# Patient Record
Sex: Male | Born: 1960 | Race: Black or African American | Hispanic: No | Marital: Married | Smoking: Never smoker
Health system: Southern US, Community
[De-identification: ages and names within clinical notes are randomized; demographics above are authoritative.]

## PROBLEM LIST (undated history)

## (undated) DIAGNOSIS — K649 Unspecified hemorrhoids: Secondary | ICD-10-CM

## (undated) HISTORY — DX: Unspecified hemorrhoids: K64.9

## (undated) HISTORY — PX: WISDOM TOOTH EXTRACTION: SHX21

---

## 2004-03-01 ENCOUNTER — Ambulatory Visit: Payer: Self-pay

## 2012-07-05 ENCOUNTER — Ambulatory Visit: Payer: Self-pay | Admitting: General Surgery

## 2012-07-26 ENCOUNTER — Encounter: Payer: Self-pay | Admitting: *Deleted

## 2012-08-02 ENCOUNTER — Ambulatory Visit: Payer: Self-pay | Admitting: General Surgery

## 2012-08-30 ENCOUNTER — Ambulatory Visit: Payer: Self-pay | Admitting: General Surgery

## 2012-10-11 ENCOUNTER — Ambulatory Visit: Payer: Self-pay | Admitting: General Surgery

## 2012-11-04 ENCOUNTER — Encounter: Payer: Self-pay | Admitting: *Deleted

## 2013-01-11 ENCOUNTER — Ambulatory Visit (INDEPENDENT_AMBULATORY_CARE_PROVIDER_SITE_OTHER): Payer: 59 | Admitting: General Surgery

## 2013-01-11 ENCOUNTER — Encounter: Payer: Self-pay | Admitting: General Surgery

## 2013-01-11 VITALS — BP 108/70 | HR 68 | Resp 12 | Ht 74.0 in | Wt 213.0 lb

## 2013-01-11 DIAGNOSIS — K625 Hemorrhage of anus and rectum: Secondary | ICD-10-CM | POA: Insufficient documentation

## 2013-01-11 DIAGNOSIS — K649 Unspecified hemorrhoids: Secondary | ICD-10-CM

## 2013-01-11 NOTE — Patient Instructions (Signed)
Patient wishes to check schedule and call the office back once ready to proceed with colonoscopy.

## 2013-01-11 NOTE — Progress Notes (Signed)
Patient ID: Jose Hutchinson, male   DOB: 22-Jan-1960, 52 y.o.   MRN: [id number]  Chief Complaint  Patient presents with  . Other    hemorrhoids    HPI Jose Hutchinson is a 52 y.o. male here today for a evaluation of hemorrhoids. States he has random episodes of rectal bleeding worse with constipation. Bowels move every day and occasionally twice a day. He had reported to his primary care physician in June of this year constipation developing gradually, in spite of reports of the ileum bowel movements. Last previous episode of rectal bleeding was 2011. He has had at least three episodes over the past 6 months. Uses metamucil daily to help.  He does work out at [employer] and does strenuous exercises that may be making it worse. Blood seems to be on toilet tissue and mixed in the stool (rather than coating the stool). Nofamily history of colon cancer that he is aware of, but reports his parents (father recently passed) were not very forthcoming about history.   HPI  Past Medical History  Diagnosis Date  . Hemorrhoid     Past Surgical History  Procedure Laterality Date  . Wisdom tooth extraction      No family history on file.  Social History History  Substance Use Topics  . Smoking status: Never Smoker   . Smokeless tobacco: Never Used  . Alcohol Use: Yes    No Known Allergies  Current Outpatient Prescriptions  Medication Sig Dispense Refill  . Psyllium (METAMUCIL PO) Take by mouth daily.       No current facility-administered medications for this visit.    Review of Systems Review of Systems  Constitutional: Negative.   Respiratory: Negative.   Cardiovascular: Negative.   Gastrointestinal: Positive for abdominal pain, abdominal distention, anal bleeding and rectal pain. Negative for nausea, vomiting, diarrhea, constipation and blood in stool.    Blood pressure 108/70, pulse 68, resp. rate 12, height 6\' 2"  (1.88 m), weight 213 lb (96.616 kg).  Physical Exam Physical Exam   Constitutional: He is oriented to person, place, and time. He appears well-developed and well-nourished.  Cardiovascular: Normal rate and regular rhythm.   Pulmonary/Chest: Effort normal and breath sounds normal.  Abdominal: Soft. Bowel sounds are normal. He exhibits no distension and no mass. There is no tenderness.  Genitourinary: Rectal exam shows internal hemorrhoid. Rectal exam shows no external hemorrhoid, no fissure and anal tone normal. Guaiac negative stool. Prostate is not enlarged and not tender.  Neurological: He is alert and oriented to person, place, and time.  Skin: Skin is warm and dry.    Data Reviewed PCP notes of June 2014.  Anoscopy was completed. Some minimally prominent internal hemorrhoids without evidence of bleeding appreciated. Visualized lower rectal mucosa was normal. Stool is Hemoccult negative.  Assessment    History of recovery did episodes of rectal bleeding with blood mixed in with the stool, modest internal hemorrhoids on anoscopy.     Plan    His bleeding was likely related to the internal hemorrhoids, but the report of blood mixed in with the stool is not typical for this problem. A screening colonoscopy was felt appropriate, and the pros and cons of the procedure reviewed.  The patient had several laboratory studies completed in June a copy of these will be requested.     Patient wishes to check schedule and call the office back once ready to proceed with colonoscopy. Miralax prescription will be sent in once date is arranged.  Earline Mayotte 01/11/2013, 9:33 PM

## 2013-09-14 ENCOUNTER — Telehealth: Payer: Self-pay | Admitting: *Deleted

## 2013-09-14 MED ORDER — POLYETHYLENE GLYCOL 3350 17 GM/SCOOP PO POWD: ORAL | Status: DC

## 2013-09-14 NOTE — Telephone Encounter (Signed)
Patient has been scheduled for a colonoscopy on 10-05-13 at [facility]. Miralax prescription has been sent to patient's pharmacy.   Dr. Bary Castilla doesn't need to see him officially until the morning of colonoscopy unless he has questions. Patient at this time declines MD office visit.   Patient has been asked to stop by to see RN/CMA to confirm no change in health, medication review, weight and BP check. ([place]). This has been arranged for 10-03-13 per patient's request since he already has the day off work.

## 2013-09-29 ENCOUNTER — Other Ambulatory Visit: Payer: Self-pay | Admitting: General Surgery

## 2013-09-29 DIAGNOSIS — K649 Unspecified hemorrhoids: Secondary | ICD-10-CM

## 2013-10-03 ENCOUNTER — Ambulatory Visit (INDEPENDENT_AMBULATORY_CARE_PROVIDER_SITE_OTHER): Payer: Self-pay | Admitting: *Deleted

## 2013-10-03 VITALS — BP 128/72 | HR 78 | Resp 14 | Ht 74.0 in | Wt 211.0 lb

## 2013-10-03 DIAGNOSIS — K625 Hemorrhage of anus and rectum: Secondary | ICD-10-CM

## 2013-10-03 DIAGNOSIS — K649 Unspecified hemorrhoids: Secondary | ICD-10-CM

## 2013-10-03 NOTE — Progress Notes (Signed)
Patient states he is still having rectal bleeding noted in bowl and on paper. Bleeding is the same as last visit. No new changes in history.

## 2013-10-05 ENCOUNTER — Ambulatory Visit: Payer: Self-pay | Admitting: General Surgery

## 2013-10-05 DIAGNOSIS — K625 Hemorrhage of anus and rectum: Secondary | ICD-10-CM

## 2013-10-05 DIAGNOSIS — K649 Unspecified hemorrhoids: Secondary | ICD-10-CM

## 2013-10-05 DIAGNOSIS — Z1211 Encounter for screening for malignant neoplasm of colon: Secondary | ICD-10-CM

## 2013-10-07 ENCOUNTER — Other Ambulatory Visit: Payer: Self-pay | Admitting: *Deleted

## 2013-10-07 ENCOUNTER — Encounter: Payer: Self-pay | Admitting: General Surgery

## 2013-10-07 DIAGNOSIS — K625 Hemorrhage of anus and rectum: Secondary | ICD-10-CM

## 2013-10-07 HISTORY — PX: COLONOSCOPY: SHX174

## 2013-10-07 MED ORDER — HYDROCORTISONE ACETATE 25 MG RE SUPP: 25.0000 mg | Freq: Two times a day (BID) | RECTAL | Status: DC

## 2013-10-17 ENCOUNTER — Ambulatory Visit (INDEPENDENT_AMBULATORY_CARE_PROVIDER_SITE_OTHER): Payer: Self-pay | Admitting: General Surgery

## 2013-10-17 ENCOUNTER — Encounter: Payer: Self-pay | Admitting: General Surgery

## 2013-10-17 ENCOUNTER — Ambulatory Visit: Payer: 59 | Admitting: General Surgery

## 2013-10-17 VITALS — BP 128/78 | HR 76 | Resp 12 | Ht 72.0 in | Wt 208.0 lb

## 2013-10-17 DIAGNOSIS — K625 Hemorrhage of anus and rectum: Secondary | ICD-10-CM

## 2013-10-17 DIAGNOSIS — K648 Other hemorrhoids: Secondary | ICD-10-CM

## 2013-10-17 NOTE — Progress Notes (Signed)
Patient ID: Jose Hutchinson., male   DOB: 1960/03/27, 53 y.o.   MRN: [id number]  Chief Complaint  Patient presents with  . Routine Post Op    colonoscopy    HPI Macari Zalesky. is a 53 y.o. male  Here today for his post op colonoscopy done on 10/07/13. Patient states he is doing well. The study was completed for a history of rectal bleeding. HPI  Past Medical History  Diagnosis Date  . Hemorrhoid     Past Surgical History  Procedure Laterality Date  . Wisdom tooth extraction    . Colonoscopy  10/07/13    No family history on file.  Social History History  Substance Use Topics  . Smoking status: Never Smoker   . Smokeless tobacco: Never Used  . Alcohol Use: Yes    No Known Allergies  Current Outpatient Prescriptions  Medication Sig Dispense Refill  . hydrocortisone (ANUSOL-HC) 25 MG suppository Place 1 suppository (25 mg total) rectally 2 (two) times daily.  12 suppository  0  . polyethylene glycol powder (GLYCOLAX/MIRALAX) powder 255 grams one bottle for colonoscopy prep  255 g  0  . Psyllium (METAMUCIL PO) Take by mouth daily.       No current facility-administered medications for this visit.    Review of Systems Review of Systems  Constitutional: Negative.   Respiratory: Negative.   Cardiovascular: Negative.     Blood pressure 128/78, pulse 76, resp. rate 12, height 6' (1.829 m), weight 208 lb (94.348 kg).  Physical Exam Physical Exam Rectal exam showed normal sphincter tone. Anoscopy showed a nonbleeding mildly inflamed anterior internal hemorrhoid.    Assessment    Intermittent rectal bleeding secondary to internal hemorrhoids.     Plan    The patient reports properly for symptoms with use of medicated suppositories. A prescription for Anusol-HC suppositories, #12 with the inscription one PR b.i.d. As needed with 2 refills was provided. The patient will call she appreciates a change in his infrequent episodes of rectal bleeding     PCP: Frazier Butt 10/18/2013, 9:15 PM

## 2013-10-17 NOTE — Patient Instructions (Signed)
Patient to return in 10 years colonoscopy

## 2013-10-18 DIAGNOSIS — K648 Other hemorrhoids: Secondary | ICD-10-CM | POA: Insufficient documentation

## 2015-06-04 ENCOUNTER — Encounter: Payer: Self-pay | Admitting: General Surgery

## 2015-06-04 NOTE — Progress Notes (Unsigned)
RX request for Anusol HC supp.  Seen 09/2013 w/ rectal bleeding. Negative colonoscopy.  RX sent to [facility].

## 2016-06-19 ENCOUNTER — Other Ambulatory Visit: Payer: Self-pay

## 2016-06-19 DIAGNOSIS — K625 Hemorrhage of anus and rectum: Secondary | ICD-10-CM

## 2016-06-19 NOTE — Telephone Encounter (Signed)
Informed patient his last visit was in 03/2013 unable to fill due to him being a new patient.

## 2016-06-19 NOTE — Telephone Encounter (Signed)
Agreed, unfortunately he is considered a new patient because it has been >3 years since his last visit. If he has any questions, please let me know. Thank you!

## 2017-06-22 ENCOUNTER — Emergency Department
Admission: EM | Admit: 2017-06-22 | Discharge: 2017-06-22 | Disposition: A | Discharge status: Home / Self Care | Payer: BLUE CROSS/BLUE SHIELD | Attending: Emergency Medicine | Admitting: Emergency Medicine

## 2017-06-22 ENCOUNTER — Other Ambulatory Visit: Payer: Self-pay

## 2017-06-22 ENCOUNTER — Emergency Department: Payer: BLUE CROSS/BLUE SHIELD

## 2017-06-22 ENCOUNTER — Encounter: Payer: Self-pay | Admitting: Emergency Medicine

## 2017-06-22 DIAGNOSIS — S0181XA Laceration without foreign body of other part of head, initial encounter: Secondary | ICD-10-CM

## 2017-06-22 DIAGNOSIS — S0219XB Other fracture of base of skull, initial encounter for open fracture: Secondary | ICD-10-CM | POA: Diagnosis not present

## 2017-06-22 DIAGNOSIS — S098XXA Other specified injuries of head, initial encounter: Secondary | ICD-10-CM | POA: Diagnosis present

## 2017-06-22 DIAGNOSIS — S01112A Laceration without foreign body of left eyelid and periocular area, initial encounter: Secondary | ICD-10-CM | POA: Diagnosis not present

## 2017-06-22 DIAGNOSIS — S82031A Displaced transverse fracture of right patella, initial encounter for closed fracture: Secondary | ICD-10-CM | POA: Diagnosis not present

## 2017-06-22 DIAGNOSIS — W11XXXA Fall on and from ladder, initial encounter: Secondary | ICD-10-CM | POA: Diagnosis not present

## 2017-06-22 DIAGNOSIS — Z23 Encounter for immunization: Secondary | ICD-10-CM | POA: Diagnosis not present

## 2017-06-22 DIAGNOSIS — Y999 Unspecified external cause status: Secondary | ICD-10-CM | POA: Insufficient documentation

## 2017-06-22 DIAGNOSIS — Z79899 Other long term (current) drug therapy: Secondary | ICD-10-CM | POA: Insufficient documentation

## 2017-06-22 DIAGNOSIS — S0990XA Unspecified injury of head, initial encounter: Secondary | ICD-10-CM

## 2017-06-22 DIAGNOSIS — Y939 Activity, unspecified: Secondary | ICD-10-CM | POA: Diagnosis not present

## 2017-06-22 DIAGNOSIS — Y929 Unspecified place or not applicable: Secondary | ICD-10-CM | POA: Insufficient documentation

## 2017-06-22 MED ORDER — AMOXICILLIN-POT CLAVULANATE 875-125 MG PO TABS: 1.0000 | ORAL_TABLET | Freq: Two times a day (BID) | ORAL | 0 refills | Status: DC

## 2017-06-22 MED ORDER — TETANUS-DIPHTH-ACELL PERTUSSIS 5-2.5-18.5 LF-MCG/0.5 IM SUSP
0.5000 mL | Freq: Once | INTRAMUSCULAR | Status: AC
  Administered 2017-06-22: 0.5 mL via INTRAMUSCULAR
  Filled 2017-06-22: qty 0.5

## 2017-06-22 MED ORDER — LIDOCAINE-EPINEPHRINE 1 %-1:100000 IJ SOLN
INTRAMUSCULAR | Status: AC
  Administered 2017-06-22: 13:00:00
  Filled 2017-06-22: qty 1

## 2017-06-22 NOTE — ED Notes (Signed)
See triage note  States he was up a ladder and the ladder slipped  He fell  Landed on right side   having pain with swelling to right wrist  Good pulses  Also having some discomfort to right knee no swelling noted to knee  Also hit head  Laceration noted to forehead

## 2017-06-22 NOTE — ED Provider Notes (Signed)
[facility] Emergency Department Provider Note ____________________________________________   First MD Initiated Contact with Patient 06/22/17 1114     (approximate)  I have reviewed the triage vital signs and the nursing notes.   HISTORY  Chief Complaint Laceration and Fall   HPI Jose Hutchinson. is a 57 y.o. male who presents to the emergency department for treatment and evaluation of laceration to the left eyebrow, right knee and left wrist pain. He states that he was starting to go up the ladder when it slipped out from underneath him.  He struck his head on the latter.  He states that he felt dazed but does not believe that he actually lost consciousness.  He states that he grabbed his shirt and wrapped around his head to control the bleeding.  He was then able to get up and walk and to get his phone to call his wife.  He is unsure of his last tetanus booster.   Past Medical History:  Diagnosis Date  . Hemorrhoid     Patient Active Problem List   Diagnosis Date Noted  . Internal bleeding hemorrhoids 10/18/2013  . Rectal bleeding 01/11/2013  . Hemorrhoids 01/11/2013    Past Surgical History:  Procedure Laterality Date  . COLONOSCOPY  10/07/13  . WISDOM TOOTH EXTRACTION      Prior to Admission medications   Medication Sig Start Date End Date Taking? Authorizing Provider  amoxicillin-clavulanate (AUGMENTIN) 875-125 MG tablet Take 1 tablet by mouth 2 (two) times daily. 06/22/17   Tigerlily Christine, Dessa Phi, FNP  hydrocortisone (ANUSOL-HC) 25 MG suppository Place 1 suppository (25 mg total) rectally 2 (two) times daily. 10/07/13   Christene Lye, MD  polyethylene glycol powder (GLYCOLAX/MIRALAX) powder 255 grams one bottle for colonoscopy prep 09/14/13   Robert Bellow, MD  Psyllium (METAMUCIL PO) Take by mouth daily.    [provider]    Allergies Patient has no known allergies.  No family history on file.  Social History Social  History   Tobacco Use  . Smoking status: Never Smoker  . Smokeless tobacco: Never Used  Substance Use Topics  . Alcohol use: Yes  . Drug use: No    Review of Systems  Constitutional: No fever/chills Eyes: No visual changes. No eye pain. ENT: No sore throat. Cardiovascular: Denies chest pain. Respiratory: Denies shortness of breath. Gastrointestinal: No abdominal pain.  No nausea, no vomiting.  No diarrhea.  No constipation. Genitourinary: Negative for dysuria. Musculoskeletal: Negative for back pain. Skin: Negative for rash. Neurological: Negative for headaches, focal weakness or numbness. ____________________________________________   PHYSICAL EXAM:  VITAL SIGNS: ED Triage Vitals  Enc Vitals Group     BP 06/22/17 0950 134/87     Pulse Rate 06/22/17 0950 70     Resp 06/22/17 0950 20     Temp 06/22/17 0950 98.2 F (36.8 C)     Temp Source 06/22/17 0950 Oral     SpO2 06/22/17 0950 100 %     Weight 06/22/17 0951 210 lb (95.3 kg)     Height 06/22/17 0951 6\' 2"  (1.88 m)     Head Circumference --      Peak Flow --      Pain Score 06/22/17 0957 6     Pain Loc --      Pain Edu? --      Excl. in [place]? --     Constitutional: Alert and oriented. Well appearing and in no acute distress. Eyes: Conjunctivae  are normal. PERRL. EOMI. Head: Atraumatic. Nose: No congestion/rhinnorhea. Mouth/Throat: Mucous membranes are moist.  Oropharynx non-erythematous. Neck: No stridor.  Supple. No focal midline tenderness. Cardiovascular: Normal rate, regular rhythm. Grossly normal heart sounds.  Good peripheral circulation. Respiratory: Normal respiratory effort.  No retractions. Lungs CTAB. Gastrointestinal: Soft and nontender. No distention. No abdominal bruits. No CVA tenderness. Musculoskeletal: Expressive muscles of the face are intact.  Right knee tenderness over the patella. No open wound.  Neurologic:  Normal speech and language. No gross focal neurologic deficits are appreciated. No  gait instability. Skin: 4 cm laceration noted through the left eyebrow and extends vertically to the forehead. Psychiatric: Mood and affect are normal. Speech and behavior are normal.  ____________________________________________   LABS (all labs ordered are listed, but only abnormal results are displayed)  Labs Reviewed - No data to display ____________________________________________  EKG  Patient refused. ____________________________________________  RADIOLOGY  Official radiology report(s): Dg Wrist Complete Right  Result Date: 06/22/2017 CLINICAL DATA:  Right wrist pain after fall. EXAM: RIGHT WRIST - COMPLETE 3+ VIEW COMPARISON:  None. FINDINGS: There is no evidence of fracture or dislocation. There is no evidence of arthropathy or other focal bone abnormality. Soft tissues are unremarkable. IMPRESSION: No definite abnormality seen in the right wrist. Electronically Signed   By: Marijo Conception, M.D.   On: 06/22/2017 10:21   Ct Head Wo Contrast  Result Date: 06/22/2017 CLINICAL DATA:  Fall off a ladder with left frontal injury. Initial encounter. EXAM: CT HEAD WITHOUT CONTRAST TECHNIQUE: Contiguous axial images were obtained from the base of the skull through the vertex without intravenous contrast. COMPARISON:  None. FINDINGS: Brain: No evidence of acute infarction, hemorrhage, hydrocephalus, extra-axial collection or mass lesion/mass effect. Vascular: Negative Skull: Fracture through the anterior wall of the left frontal sinus with depression by up to 2 mm. The fracture continues to the left superior orbital rim. No posterior frontal sinus fracture is seen. Sinuses/Orbits: See above concerning fractures. No postseptal stranding. IMPRESSION: 1. No evidence of intracranial injury. 2. Left frontal sinus anterior wall fracture with depression by 2 mm. Fracture extends to the superior orbital rim. Electronically Signed   By: Monte Fantasia M.D.   On: 06/22/2017 12:08   Dg Knee Complete 4  Views Right  Result Date: 06/22/2017 CLINICAL DATA:  Anterior knee pain after falling from a ladder today. Initial encounter. EXAM: RIGHT KNEE - COMPLETE 4+ VIEW COMPARISON:  None. FINDINGS: There is a minimally displaced fracture involving the lower pole of the patella, best seen on the lateral view. The patella is located. The distal femur, proximal tibia and proximal fibula appear intact. There is a small knee joint effusion. IMPRESSION: Minimally displaced fracture of the lower pole of the patella. This has the propensity to become more displaced with continued weight-bearing. Electronically Signed   By: Richardean Sale M.D.   On: 06/22/2017 12:04    ____________________________________________   PROCEDURES  Procedure(s) performed:   Marland KitchenMarland KitchenLaceration Repair Date/Time: 06/22/2017 4:15 PM Performed by: Victorino Dike, FNP Authorized by: Victorino Dike, FNP   Consent:    Consent obtained:  Verbal   Consent given by:  Patient   Risks discussed:  Pain, poor cosmetic result, need for additional repair and poor wound healing Anesthesia (see MAR for exact dosages):    Anesthesia method:  Local infiltration   Local anesthetic:  Lidocaine 1% WITH epi Laceration details:    Location:  Face   Face location:  L eyebrow   Length (  cm):  4.5 Repair type:    Repair type:  Intermediate Pre-procedure details:    Preparation:  Patient was prepped and draped in usual sterile fashion and imaging obtained to evaluate for foreign bodies Exploration:    Hemostasis achieved with:  Epinephrine   Contaminated: no   Treatment:    Area cleansed with:  Betadine   Amount of cleaning:  Standard   Irrigation solution:  Sterile saline   Irrigation method:  Syringe Skin repair:    Repair method:  Sutures   Suture size:  6-0   Suture material:  Prolene   Suture technique:  Simple interrupted   Number of sutures:  7 Approximation:    Approximation:  Close Post-procedure details:    Dressing:  Open (no  dressing)   Patient tolerance of procedure:  Tolerated well, no immediate complications    Critical Care performed: No  ____________________________________________   INITIAL IMPRESSION / ASSESSMENT AND PLAN / ED COURSE  As part of my medical decision making, I reviewed the following data within the electronic MEDICAL RECORD NUMBER    57 year old male presenting to the emergency department for treatment and evaluation after a fall when a ladder slipped and he and the latter both went down.  CT of the head was obtained which shows a left anterior wall frontal sinus fracture with a 2 mm depression.  The fracture extends to the superior orbital rim.  The patient had no pain.  Extraocular eye movements were demonstrated.  He had no complaint of blurred vision or change in vision.  Otherwise, the CT of the head showed no evidence of intracranial injury.  The x-ray of his right knee does show a minimally displaced fracture of the lower pole of the patella.  The patient was placed in a knee immobilizer and given strict instructions to avoid full weightbearing until he is followed by the orthopedist.  The patient was very reluctant to wear the knee immobilizer and was again advised that he may have long-term complications if he does not comply with recommendations.  Patient will be discharged home.  Due to the laceration in the area of fracture he will be treated for an open sinus fracture with Augmentin.  Patient refused any type of pain medication and declined any pain medication while here.  Tetanus vaccination was updated today.  Patient was given a work note for the next several days, but was advised that he will need to see the primary care provider before returning to work if he is unable to see the orthopedist first.  He was also encouraged to schedule an appointment with the ear, nose, and throat specialist.  Strict ER return precautions were discussed with both him and his wife.  They verbalized  understanding.      ____________________________________________   FINAL CLINICAL IMPRESSION(S) / ED DIAGNOSES  Final diagnoses:  Injury of head, initial encounter  Facial laceration, initial encounter  Open fracture of frontal sinus, initial encounter ([place])  Closed displaced transverse fracture of right patella, initial encounter     ED Discharge Orders        Ordered    amoxicillin-clavulanate (AUGMENTIN) 875-125 MG tablet  2 times daily     06/22/17 1256       Note:  This document was prepared using Dragon voice recognition software and may include unintentional dictation errors.    Victorino Dike, FNP 06/22/17 1621    Nena Polio, MD 06/22/17 [phone]

## 2017-06-22 NOTE — ED Notes (Signed)
First Nurse Note:  Patient to ED from POV via [place], states he fell from an extension ladder this AM.  States he felt dizzy after falling.  Large laceration over left eye, not actively bleeding. Sterile dressing applied.  Patient also complaining of left wrist pain.  Skin warm and dry. Alert and oriented.

## 2017-06-22 NOTE — ED Notes (Signed)
Pt stated he did pass out once he fell from ladder and is refusing an EKG. Pt stated "There is nothing wrong with my heart. I just need some stitches." Kim,RN aware.

## 2017-06-22 NOTE — ED Triage Notes (Signed)
Fell off ladder , was starting up ladder and ladder slipped out from under him. Hit head. Dressing around head with not bleed through noted. Can see laceration L brow, patient says extends up forehead. Denies LOC. States was dazed briefly.

## 2017-06-22 NOTE — Discharge Instructions (Signed)
Please call and schedule appointments with both orthopedics and ENT specialist. It is important that you take the antibiotics as prescribed.  Return to the ER for symptoms that change, worsen, or for new concerns.

## 2018-02-04 ENCOUNTER — Other Ambulatory Visit: Payer: Self-pay | Admitting: Internal Medicine

## 2018-02-04 DIAGNOSIS — R3121 Asymptomatic microscopic hematuria: Secondary | ICD-10-CM

## 2018-02-15 ENCOUNTER — Ambulatory Visit
Admission: RE | Admit: 2018-02-15 | Discharge: 2018-02-15 | Disposition: A | Discharge status: Home / Self Care | Payer: BLUE CROSS/BLUE SHIELD | Source: Ambulatory Visit | Attending: Internal Medicine | Admitting: Internal Medicine

## 2018-02-15 DIAGNOSIS — R3121 Asymptomatic microscopic hematuria: Secondary | ICD-10-CM

## 2018-02-15 MED ORDER — IOPAMIDOL (ISOVUE-300) INJECTION 61%
125.0000 mL | Freq: Once | INTRAVENOUS | Status: AC | PRN
  Administered 2018-02-15: 125 mL via INTRAVENOUS

## 2018-03-03 ENCOUNTER — Telehealth: Payer: Self-pay | Admitting: Radiology

## 2018-03-03 ENCOUNTER — Other Ambulatory Visit: Payer: Self-pay | Admitting: Radiology

## 2018-03-03 ENCOUNTER — Encounter: Payer: Self-pay | Admitting: Urology

## 2018-03-03 ENCOUNTER — Ambulatory Visit (INDEPENDENT_AMBULATORY_CARE_PROVIDER_SITE_OTHER): Payer: BLUE CROSS/BLUE SHIELD | Admitting: Urology

## 2018-03-03 VITALS — BP 144/90 | HR 62 | Ht 74.0 in | Wt 213.2 lb

## 2018-03-03 DIAGNOSIS — D4111 Neoplasm of uncertain behavior of right renal pelvis: Secondary | ICD-10-CM

## 2018-03-03 DIAGNOSIS — R3121 Asymptomatic microscopic hematuria: Secondary | ICD-10-CM | POA: Diagnosis not present

## 2018-03-03 LAB — URINALYSIS, COMPLETE
Bilirubin, UA: NEGATIVE
Glucose, UA: NEGATIVE
Ketones, UA: NEGATIVE
Leukocytes, UA: NEGATIVE
NITRITE UA: NEGATIVE
PH UA: 6 (ref 5.0–7.5)
Specific Gravity, UA: 1.025 (ref 1.005–1.030)
UUROB: 0.2 mg/dL (ref 0.2–1.0)

## 2018-03-03 LAB — MICROSCOPIC EXAMINATION

## 2018-03-03 NOTE — Patient Instructions (Signed)
Ureteroscopy This sheet gives you information about how to care for yourself after your procedure. Your health care provider may also give you more specific instructions. If you have problems or questions, contact your health care provider. What can I expect after the procedure? After the procedure, it is common to have:  A burning sensation when you urinate.  Blood in your urine.  Mild discomfort in the bladder area or kidney area when urinating.  Needing to urinate more often or urgently. Follow these instructions at home:  Medicines  Take over-the-counter and prescription medicines only as told by your health care provider.  If you were prescribed an antibiotic medicine, take it as told by your health care provider. Do not stop taking the antibiotic even if you start to feel better. General instructions  Donot drive for 24 hours if you were given a medicine to help you relax (sedative) during your procedure.  To relieve burning, try taking a warm bath or holding a warm washcloth over your groin.  Drink enough fluid to keep your urine clear or pale yellow. ? Drink two 8-ounce glasses of water every hour for the first 2 hours after you get home. ? Continue to drink water often at home.  You can eat what you usually do.  Keep all follow-up visits as told by your health care provider. This is important. ? If you had a tube placed to keep urine flowing (ureteral stent), ask your health care provider when you need to return to have it removed. Contact a health care provider if:  You have chills or a fever.  You have burning pain for longer than 24 hours after the procedure.  You have blood in your urine for longer than 24 hours after the procedure. Get help right away if:  You have large amounts of blood in your urine.  You have blood clots in your urine.  You have very bad pain.  You have chest pain or trouble breathing.  You are unable to urinate and you have the  feeling of a full bladder. This information is not intended to replace advice given to you by your health care provider. Make sure you discuss any questions you have with your health care provider. Document Released: 01/11/2013 Document Revised: 10/23/2015 Document Reviewed: 10/19/2015 Elsevier Interactive Patient Education  2019 [employer].

## 2018-03-03 NOTE — Telephone Encounter (Signed)
Patient was given the [facility] Information form below as well as the Instructions for Pre-Admission Testing form & a map of [facility].   [address], [place], [place] [address] Telephone: [phone] Fax: [phone]   Thank you for choosing [facility] for your upcoming surgery!  We are always here to assist in your urological needs.  Please read the following information with specific details for your upcoming appointments related to your surgery. Please contact Brayen Bunn at [phone] Option 3 with any questions.  The Name of Your Surgery: cystoscopy, right ureteroscopy, biopsy, stent placement Your Surgery Date: 03/08/2018 Your Surgeon: Hollice Espy  Please call Same Day Surgery at [phone] between the hours of 1pm-3pm one day prior to your surgery. They will inform you of the time to arrive at Same Day Surgery which is located on the second floor of the [facility].   Please refer to the attached letter regarding instructions for Pre-Admission Testing. You will receive a call from the [place] office regarding your appointment with them.  The Pre-Admission Testing office is located at [place], on the first floor of the [facility] in [place] (office is to the right as you enter through the [employer] of the [facility]). Please have all medications you are currently taking and your insurance card available.   Patient was advised to have nothing to eat or drink after midnight the night prior to surgery except that he may have only water until 2 hours before surgery with nothing to drink within 2 hours of surgery.  The patient states he currently takes no blood thinners.  Patient's questions were answered and he expressed understanding of these instructions.

## 2018-03-03 NOTE — H&P (View-Only) (Signed)
03/03/2018 9:11 AM   Jose Hutchinson. [birth year] [id number]  Referring provider: Steele Sizer, MD [address] [place], [place] [address]  Chief Complaint  Patient presents with  . Hematuria    HPI:  58 yo male referred for right renal pelvis mass on CT February 15, 2018.  He underwent urinalysis January 25, 2018 which revealed 4-10 red blood cells per high-powered field, 4-10 wbc's and no bacteria. He appropriately underwent CT scan of the abdomen and pelvis February 15, 2018.  This revealed a large filling defect in the right upper and midpole calyces and renal pelvis that was 46 Hounsfield units on the noncontrast phase, 73 Hounsfield units on the contrasted phase and a large filling defect on the delayed imaging. There was no metastaic disease. The patient did fall from a ladder but that was April or May of 2019 and broke bones on his right side. He did not see blood at the time but thought he saw red urine a few months ago at the gym - only one time and no clots. No flank pain. He is active and does frequent medicine ball workouts. Weight is stable.   He smoked for 10 years and stopped in his 45's. He has no h/o dye, chemical or radiation/chemo exposure. He voids adequately and has no bothersome LUTS.   A PSA was 0.83 January 25, 2018.   PMH: Past Medical History:  Diagnosis Date  . Hemorrhoid     Surgical History: Past Surgical History:  Procedure Laterality Date  . COLONOSCOPY  10/07/13  . WISDOM TOOTH EXTRACTION      Home Medications:  Allergies as of 03/03/2018   No Known Allergies     Medication List       Accurate as of March 03, 2018  9:11 AM. Always use your most recent med list.        hydrocortisone 25 MG suppository Commonly known as:  ANUSOL-HC Place 1 suppository (25 mg total) rectally 2 (two) times daily.   METAMUCIL PO Take by mouth daily.   polyethylene glycol powder powder Commonly known as:  GLYCOLAX/MIRALAX 255 grams one  bottle for colonoscopy prep   sildenafil 20 MG tablet Commonly known as:  REVATIO Take by mouth.       Allergies: No Known Allergies  Family History: History reviewed. No pertinent family history.  Social History:  reports that he has never smoked. He has never used smokeless tobacco. He reports current alcohol use. He reports that he does not use drugs.  ROS:                                        Physical Exam: There were no vitals taken for this visit.  Constitutional:  Alert and oriented, No acute distress. HEENT: [place] AT, moist mucus membranes.  Trachea midline, no masses. Cardiovascular: No clubbing, cyanosis, or edema. Respiratory: Normal respiratory effort, no increased work of breathing. GI: Abdomen is soft, nontender, nondistended, no abdominal masses GU: No CVA tenderness Lymph: No cervical or inguinal lymphadenopathy. Skin: No rashes, bruises or suspicious lesions. Neurologic: Grossly intact, no focal deficits, moving all 4 extremities. Psychiatric: Normal mood and affect. GU:   Laboratory Data: No results found for: WBC, HGB, HCT, MCV, PLT  No results found for: CREATININE  No results found for: PSA  No results found for: TESTOSTERONE  No results found for: HGBA1C  Urinalysis No results found for: COLORURINE, APPEARANCEUR, LABSPEC, PHURINE, GLUCOSEU, HGBUR, BILIRUBINUR, KETONESUR, PROTEINUR, UROBILINOGEN, NITRITE, LEUKOCYTESUR  No results found for: LABMICR, [place], RBCUA, LABEPIT, MUCUS, BACTERIA  Pertinent Imaging: CT a/p  No results found for this or any previous visit. No results found for this or any previous visit. No results found for this or any previous visit. No results found for this or any previous visit. No results found for this or any previous visit. No results found for this or any previous visit. No results found for this or any previous visit. No results found for this or any previous visit.  Assessment &  Plan:    1. Hematuria, microscopic I discussed with the patient the nature, potential benefits, risks and alternatives to cystoscopy, right ureteroscopy, biopsy and stent placment, including side effects of the proposed treatment, the likelihood of the patient achieving the goals of the procedure, and any potential problems that might occur during the procedure or recuperation. We discussed he may need a staged procedure with pre-stent. All questions answered. Patient elects to proceed.   - Urinalysis, Complete  2. Right renal pelvis mass - as above -procedures.  He had a lot of questions we talked about the risk of bleeding and infection, injury to the urethra, bladder or ureter and kidney among others.  We discussed the need sometimes for prolonged extent of her Foley catheter and rare ureteral injury and exploratory laparotomy.  He asked that the long-term prognosis and we discussed we do not know exactly if this mass could be benign or malignant but it is of a size where he may need the right kidney removed in the future but again we do not know right now.  And ask about alternatives and we discussed continued surveillance with repeat imaging but I strongly discouraged that I recommended ureteroscopy.  He had bacteria in the urine so I sent her for culture and will send it for cytology if we have enough urine.  I also discussed with him one of my colleagues would likely be doing the procedure.   No follow-ups on file.  Festus Aloe, MD  [facility] [address] [place], [place] [address] [phone]

## 2018-03-03 NOTE — Progress Notes (Signed)
03/03/2018 9:11 AM   Jose Hutchinson. [birth year] [id number]  Referring provider: Steele Sizer, MD [address] [place], [place] [address]  Chief Complaint  Patient presents with  . Hematuria    HPI:  58 yo male referred for right renal pelvis mass on CT February 15, 2018.  He underwent urinalysis January 25, 2018 which revealed 4-10 red blood cells per high-powered field, 4-10 wbc's and no bacteria. He appropriately underwent CT scan of the abdomen and pelvis February 15, 2018.  This revealed a large filling defect in the right upper and midpole calyces and renal pelvis that was 46 Hounsfield units on the noncontrast phase, 73 Hounsfield units on the contrasted phase and a large filling defect on the delayed imaging. There was no metastaic disease. The patient did fall from a ladder but that was April or May of 2019 and broke bones on his right side. He did not see blood at the time but thought he saw red urine a few months ago at the gym - only one time and no clots. No flank pain. He is active and does frequent medicine ball workouts. Weight is stable.   He smoked for 10 years and stopped in his 28's. He has no h/o dye, chemical or radiation/chemo exposure. He voids adequately and has no bothersome LUTS.   A PSA was 0.83 January 25, 2018.   PMH: Past Medical History:  Diagnosis Date  . Hemorrhoid     Surgical History: Past Surgical History:  Procedure Laterality Date  . COLONOSCOPY  10/07/13  . WISDOM TOOTH EXTRACTION      Home Medications:  Allergies as of 03/03/2018   No Known Allergies     Medication List       Accurate as of March 03, 2018  9:11 AM. Always use your most recent med list.        hydrocortisone 25 MG suppository Commonly known as:  ANUSOL-HC Place 1 suppository (25 mg total) rectally 2 (two) times daily.   METAMUCIL PO Take by mouth daily.   polyethylene glycol powder powder Commonly known as:  GLYCOLAX/MIRALAX 255 grams one  bottle for colonoscopy prep   sildenafil 20 MG tablet Commonly known as:  REVATIO Take by mouth.       Allergies: No Known Allergies  Family History: History reviewed. No pertinent family history.  Social History:  reports that he has never smoked. He has never used smokeless tobacco. He reports current alcohol use. He reports that he does not use drugs.  ROS:                                        Physical Exam: There were no vitals taken for this visit.  Constitutional:  Alert and oriented, No acute distress. HEENT: [name] AT, moist mucus membranes.  Trachea midline, no masses. Cardiovascular: No clubbing, cyanosis, or edema. Respiratory: Normal respiratory effort, no increased work of breathing. GI: Abdomen is soft, nontender, nondistended, no abdominal masses GU: No CVA tenderness Lymph: No cervical or inguinal lymphadenopathy. Skin: No rashes, bruises or suspicious lesions. Neurologic: Grossly intact, no focal deficits, moving all 4 extremities. Psychiatric: Normal mood and affect. GU:   Laboratory Data: No results found for: WBC, HGB, HCT, MCV, PLT  No results found for: CREATININE  No results found for: PSA  No results found for: TESTOSTERONE  No results found for: HGBA1C  Urinalysis No results found for: COLORURINE, APPEARANCEUR, LABSPEC, PHURINE, GLUCOSEU, HGBUR, BILIRUBINUR, KETONESUR, PROTEINUR, UROBILINOGEN, NITRITE, LEUKOCYTESUR  No results found for: LABMICR, [place], RBCUA, LABEPIT, MUCUS, BACTERIA  Pertinent Imaging: CT a/p  No results found for this or any previous visit. No results found for this or any previous visit. No results found for this or any previous visit. No results found for this or any previous visit. No results found for this or any previous visit. No results found for this or any previous visit. No results found for this or any previous visit. No results found for this or any previous visit.  Assessment &  Plan:    1. Hematuria, microscopic I discussed with the patient the nature, potential benefits, risks and alternatives to cystoscopy, right ureteroscopy, biopsy and stent placment, including side effects of the proposed treatment, the likelihood of the patient achieving the goals of the procedure, and any potential problems that might occur during the procedure or recuperation. We discussed he may need a staged procedure with pre-stent. All questions answered. Patient elects to proceed.   - Urinalysis, Complete  2. Right renal pelvis mass - as above -procedures.  He had a lot of questions we talked about the risk of bleeding and infection, injury to the urethra, bladder or ureter and kidney among others.  We discussed the need sometimes for prolonged extent of her Foley catheter and rare ureteral injury and exploratory laparotomy.  He asked that the long-term prognosis and we discussed we do not know exactly if this mass could be benign or malignant but it is of a size where he may need the right kidney removed in the future but again we do not know right now.  And ask about alternatives and we discussed continued surveillance with repeat imaging but I strongly discouraged that I recommended ureteroscopy.  He had bacteria in the urine so I sent her for culture and will send it for cytology if we have enough urine.  I also discussed with him one of my colleagues would likely be doing the procedure.   No follow-ups on file.  Festus Aloe, MD  [facility] [address] [place], [place] [address] [phone]

## 2018-03-04 ENCOUNTER — Encounter
Admission: RE | Admit: 2018-03-04 | Discharge: 2018-03-04 | Disposition: A | Discharge status: Home / Self Care | Payer: BLUE CROSS/BLUE SHIELD | Source: Ambulatory Visit | Attending: Urology | Admitting: Urology

## 2018-03-04 ENCOUNTER — Other Ambulatory Visit: Payer: Self-pay

## 2018-03-04 NOTE — Patient Instructions (Signed)
Your procedure is scheduled on: 03/08/18 Report to Day Surgery.[facility] SECOND FLOOR To find out your arrival time please call [phone] between 1PM - 3PM on 2/14/290.  Remember: Instructions that are not followed completely may result in serious medical risk,  up to and including death, or upon the discretion of your surgeon and anesthesiologist your  surgery may need to be rescheduled.     _X__ 1. Do not eat food after midnight the night before your procedure.                 No gum chewing or hard candies. You may drink clear liquids up to 2 hours                 before you are scheduled to arrive for your surgery- DO not drink clear                 liquids within 2 hours of the start of your surgery.                 Clear Liquids include:  water, apple juice without pulp, clear carbohydrate                 drink such as Clearfast of Gatorade, Black Coffee or Tea (Do not add                 anything to coffee or tea).  __X__2.  On the morning of surgery brush your teeth with toothpaste and water, you                may rinse your mouth with mouthwash if you wish.  Do not swallow any toothpaste of mouthwash.     _X__ 3.  No Alcohol for 24 hours before or after surgery.   _X__ 4.  Do Not Smoke or use e-cigarettes For 24 Hours Prior to Your Surgery.                 Do not use any chewable tobacco products for at least 6 hours prior to                 surgery.  ____  5.  Bring all medications with you on the day of surgery if instructed.   __X__  6.  Notify your doctor if there is any change in your medical condition      (cold, fever, infections).     Do not wear jewelry, make-up, hairpins, clips or nail polish. Do not wear lotions, powders, or perfumes. You may wear deodorant. Do not shave 48 hours prior to surgery. Men may shave face and neck. Do not bring valuables to the hospital.    [facility] is not responsible for any belongings or  valuables.  Contacts, dentures or bridgework may not be worn into surgery. Leave your suitcase in the car. After surgery it may be brought to your room. For patients admitted to the hospital, discharge time is determined by your treatment team.   Patients discharged the day of surgery will not be allowed to drive home.     ____ Take these medicines the morning of surgery with A SIP OF WATER:    1.   2.   3.   4.  5.  6.  ____ Fleet Enema (as directed)   ____ Use CHG Soap as directed  ____ Use inhalers on the day of surgery  ____ Stop metformin 2 days prior to surgery  ____ Take 1/2 of usual insulin dose the night before surgery. No insulin the morning          of surgery.   ____ Stop Coumadin/Plavix/aspirin on   ____ Stop Anti-inflammatories on    ____ Stop supplements until after surgery.    ____ Bring C-Pap to the hospital.

## 2018-03-06 LAB — CULTURE, URINE COMPREHENSIVE

## 2018-03-07 MED ORDER — CEFAZOLIN SODIUM-DEXTROSE 2-4 GM/100ML-% IV SOLN
2.0000 g | INTRAVENOUS | Status: AC
  Administered 2018-03-08: 2 g via INTRAVENOUS

## 2018-03-08 ENCOUNTER — Ambulatory Visit
Admission: RE | Admit: 2018-03-08 | Discharge: 2018-03-08 | Disposition: A | Discharge status: Home / Self Care | Payer: BLUE CROSS/BLUE SHIELD | Attending: Urology | Admitting: Urology

## 2018-03-08 ENCOUNTER — Encounter: Payer: Self-pay | Admitting: *Deleted

## 2018-03-08 ENCOUNTER — Ambulatory Visit: Payer: BLUE CROSS/BLUE SHIELD | Admitting: Anesthesiology

## 2018-03-08 ENCOUNTER — Other Ambulatory Visit: Payer: Self-pay

## 2018-03-08 ENCOUNTER — Encounter
Admission: RE | Disposition: A | Discharge status: Home / Self Care | Payer: Self-pay | Source: Home / Self Care | Attending: Urology

## 2018-03-08 DIAGNOSIS — D4111 Neoplasm of uncertain behavior of right renal pelvis: Secondary | ICD-10-CM

## 2018-03-08 DIAGNOSIS — C651 Malignant neoplasm of right renal pelvis: Secondary | ICD-10-CM | POA: Diagnosis not present

## 2018-03-08 DIAGNOSIS — R3129 Other microscopic hematuria: Secondary | ICD-10-CM | POA: Insufficient documentation

## 2018-03-08 DIAGNOSIS — Z79899 Other long term (current) drug therapy: Secondary | ICD-10-CM | POA: Insufficient documentation

## 2018-03-08 DIAGNOSIS — Z87891 Personal history of nicotine dependence: Secondary | ICD-10-CM | POA: Insufficient documentation

## 2018-03-08 DIAGNOSIS — N2889 Other specified disorders of kidney and ureter: Secondary | ICD-10-CM | POA: Diagnosis present

## 2018-03-08 DIAGNOSIS — R311 Benign essential microscopic hematuria: Secondary | ICD-10-CM | POA: Diagnosis not present

## 2018-03-08 HISTORY — PX: CYSTOSCOPY WITH URETEROSCOPY: SHX5123

## 2018-03-08 HISTORY — PX: CYSTOSCOPY WITH BIOPSY: SHX5122

## 2018-03-08 HISTORY — PX: CYSTOSCOPY WITH STENT PLACEMENT: SHX5790

## 2018-03-08 HISTORY — PX: CYSTOSCOPY W/ RETROGRADES: SHX1426

## 2018-03-08 SURGERY — CYSTOSCOPY WITH URETEROSCOPY
Anesthesia: General | Site: Ureter | Laterality: Right

## 2018-03-08 MED ORDER — IOTHALAMATE MEGLUMINE 43 % IV SOLN
INTRAVENOUS | Status: DC | PRN
  Administered 2018-03-08: 20 mL via URETHRAL

## 2018-03-08 MED ORDER — FAMOTIDINE 20 MG PO TABS
ORAL_TABLET | ORAL | Status: AC
  Administered 2018-03-08: 20 mg
  Filled 2018-03-08: qty 1

## 2018-03-08 MED ORDER — DEXAMETHASONE SODIUM PHOSPHATE 10 MG/ML IJ SOLN
INTRAMUSCULAR | Status: AC
  Filled 2018-03-08: qty 1

## 2018-03-08 MED ORDER — EPHEDRINE SULFATE 50 MG/ML IJ SOLN
INTRAMUSCULAR | Status: AC
  Filled 2018-03-08: qty 1

## 2018-03-08 MED ORDER — ONDANSETRON HCL 4 MG/2ML IJ SOLN
INTRAMUSCULAR | Status: AC
  Filled 2018-03-08: qty 2

## 2018-03-08 MED ORDER — MIDAZOLAM HCL 2 MG/2ML IJ SOLN
INTRAMUSCULAR | Status: DC | PRN
  Administered 2018-03-08: 2 mg via INTRAVENOUS

## 2018-03-08 MED ORDER — PROPOFOL 10 MG/ML IV BOLUS
INTRAVENOUS | Status: DC | PRN
  Administered 2018-03-08: 180 mg via INTRAVENOUS

## 2018-03-08 MED ORDER — PROPOFOL 10 MG/ML IV BOLUS
INTRAVENOUS | Status: AC
  Filled 2018-03-08: qty 20

## 2018-03-08 MED ORDER — EPHEDRINE SULFATE 50 MG/ML IJ SOLN
INTRAMUSCULAR | Status: DC | PRN
  Administered 2018-03-08: 15 mg via INTRAVENOUS

## 2018-03-08 MED ORDER — OXYBUTYNIN CHLORIDE 5 MG PO TABS: 5.0000 mg | ORAL_TABLET | Freq: Three times a day (TID) | ORAL | 0 refills | Status: DC | PRN

## 2018-03-08 MED ORDER — OXYCODONE HCL 5 MG/5ML PO SOLN: 5.0000 mg | Freq: Once | ORAL | Status: DC | PRN

## 2018-03-08 MED ORDER — LACTATED RINGERS IV SOLN
INTRAVENOUS | Status: DC
  Administered 2018-03-08: 08:00:00 via INTRAVENOUS

## 2018-03-08 MED ORDER — ROCURONIUM BROMIDE 100 MG/10ML IV SOLN
INTRAVENOUS | Status: DC | PRN
  Administered 2018-03-08: 20 mg via INTRAVENOUS
  Administered 2018-03-08: 50 mg via INTRAVENOUS

## 2018-03-08 MED ORDER — PHENYLEPHRINE HCL 10 MG/ML IJ SOLN
INTRAMUSCULAR | Status: DC | PRN
  Administered 2018-03-08 (×4): 50 ug via INTRAVENOUS

## 2018-03-08 MED ORDER — FENTANYL CITRATE (PF) 100 MCG/2ML IJ SOLN: 25.0000 ug | INTRAMUSCULAR | Status: DC | PRN

## 2018-03-08 MED ORDER — TAMSULOSIN HCL 0.4 MG PO CAPS: 0.4000 mg | ORAL_CAPSULE | Freq: Every day | ORAL | 0 refills | Status: DC

## 2018-03-08 MED ORDER — KETOROLAC TROMETHAMINE 30 MG/ML IJ SOLN
INTRAMUSCULAR | Status: DC | PRN
  Administered 2018-03-08: 10 mg via INTRAVENOUS

## 2018-03-08 MED ORDER — FENTANYL CITRATE (PF) 100 MCG/2ML IJ SOLN
INTRAMUSCULAR | Status: DC | PRN
  Administered 2018-03-08: 100 ug via INTRAVENOUS

## 2018-03-08 MED ORDER — KETOROLAC TROMETHAMINE 30 MG/ML IJ SOLN
INTRAMUSCULAR | Status: AC
  Filled 2018-03-08: qty 1

## 2018-03-08 MED ORDER — CEFAZOLIN SODIUM-DEXTROSE 2-4 GM/100ML-% IV SOLN
INTRAVENOUS | Status: AC
  Filled 2018-03-08: qty 100

## 2018-03-08 MED ORDER — FAMOTIDINE 20 MG PO TABS: 20.0000 mg | ORAL_TABLET | Freq: Once | ORAL | Status: DC

## 2018-03-08 MED ORDER — HYDROCODONE-ACETAMINOPHEN 5-325 MG PO TABS: 1.0000 | ORAL_TABLET | Freq: Four times a day (QID) | ORAL | 0 refills | Status: DC | PRN

## 2018-03-08 MED ORDER — SUGAMMADEX SODIUM 200 MG/2ML IV SOLN
INTRAVENOUS | Status: AC
  Filled 2018-03-08: qty 2

## 2018-03-08 MED ORDER — MIDAZOLAM HCL 2 MG/2ML IJ SOLN
INTRAMUSCULAR | Status: AC
  Filled 2018-03-08: qty 2

## 2018-03-08 MED ORDER — DEXAMETHASONE SODIUM PHOSPHATE 10 MG/ML IJ SOLN
INTRAMUSCULAR | Status: DC | PRN
  Administered 2018-03-08: 10 mg via INTRAVENOUS

## 2018-03-08 MED ORDER — ROCURONIUM BROMIDE 50 MG/5ML IV SOLN
INTRAVENOUS | Status: AC
  Filled 2018-03-08: qty 1

## 2018-03-08 MED ORDER — SUGAMMADEX SODIUM 200 MG/2ML IV SOLN
INTRAVENOUS | Status: DC | PRN
  Administered 2018-03-08: 200 mg via INTRAVENOUS

## 2018-03-08 MED ORDER — FENTANYL CITRATE (PF) 100 MCG/2ML IJ SOLN
INTRAMUSCULAR | Status: AC
  Filled 2018-03-08: qty 2

## 2018-03-08 MED ORDER — DOCUSATE SODIUM 100 MG PO CAPS: 100.0000 mg | ORAL_CAPSULE | Freq: Two times a day (BID) | ORAL | 0 refills | Status: DC

## 2018-03-08 MED ORDER — ONDANSETRON HCL 4 MG/2ML IJ SOLN
INTRAMUSCULAR | Status: DC | PRN
  Administered 2018-03-08: 4 mg via INTRAVENOUS

## 2018-03-08 MED ORDER — MEPERIDINE HCL 50 MG/ML IJ SOLN: 6.2500 mg | INTRAMUSCULAR | Status: DC | PRN

## 2018-03-08 MED ORDER — PROMETHAZINE HCL 25 MG/ML IJ SOLN: 6.2500 mg | INTRAMUSCULAR | Status: DC | PRN

## 2018-03-08 MED ORDER — OXYCODONE HCL 5 MG PO TABS: 5.0000 mg | ORAL_TABLET | Freq: Once | ORAL | Status: DC | PRN

## 2018-03-08 SURGICAL SUPPLY — 31 items
BAG DRAIN CYSTO-URO LG1000N (MISCELLANEOUS) ×3 IMPLANT
BASKET 3WIRE GEMINI 2.4X120 (MISCELLANEOUS) ×3 IMPLANT
BRUSH SCRUB EZ  4% CHG (MISCELLANEOUS) ×2
BRUSH SCRUB EZ 4% CHG (MISCELLANEOUS) ×1 IMPLANT
CATH URETL 5X70 OPEN END (CATHETERS) ×3 IMPLANT
CONRAY 43 FOR UROLOGY 50M (MISCELLANEOUS) IMPLANT
COVER WAND RF STERILE (DRAPES) IMPLANT
DRSG TELFA 4X3 1S NADH ST (GAUZE/BANDAGES/DRESSINGS) ×3 IMPLANT
ELECT REM PT RETURN 9FT ADLT (ELECTROSURGICAL) ×3
ELECTRODE REM PT RTRN 9FT ADLT (ELECTROSURGICAL) ×1 IMPLANT
GLOVE BIO SURGEON STRL SZ 6.5 (GLOVE) ×2 IMPLANT
GLOVE BIO SURGEONS STRL SZ 6.5 (GLOVE) ×1
GOWN STRL REUS W/ TWL LRG LVL3 (GOWN DISPOSABLE) ×2 IMPLANT
GOWN STRL REUS W/TWL LRG LVL3 (GOWN DISPOSABLE) ×4
GUIDEWIRE GREEN .038 145CM (MISCELLANEOUS) ×3 IMPLANT
GUIDEWIRE STR DUAL SENSOR (WIRE) ×3 IMPLANT
INTRODUCER DILATOR DOUBLE (INTRODUCER) ×3 IMPLANT
KIT TURNOVER CYSTO (KITS) ×3 IMPLANT
NDL SAFETY ECLIPSE 18X1.5 (NEEDLE) ×1 IMPLANT
NEEDLE HYPO 18GX1.5 SHARP (NEEDLE) ×2
PACK CYSTO AR (MISCELLANEOUS) ×3 IMPLANT
SET CYSTO W/LG BORE CLAMP LF (SET/KITS/TRAYS/PACK) ×3 IMPLANT
SHEATH URETERAL 12FR 45CM (SHEATH) ×3 IMPLANT
SOL .9 NS 3000ML IRR  AL (IV SOLUTION) ×2
SOL .9 NS 3000ML IRR UROMATIC (IV SOLUTION) ×1 IMPLANT
STENT URET 6FRX24 CONTOUR (STENTS) IMPLANT
STENT URET 6FRX26 CONTOUR (STENTS) ×3 IMPLANT
SURGILUBE 2OZ TUBE FLIPTOP (MISCELLANEOUS) ×3 IMPLANT
SYRINGE IRR TOOMEY STRL 70CC (SYRINGE) IMPLANT
WATER STERILE IRR 1000ML POUR (IV SOLUTION) ×3 IMPLANT
WATER STERILE IRR 3000ML UROMA (IV SOLUTION) ×3 IMPLANT

## 2018-03-08 NOTE — Anesthesia Postprocedure Evaluation (Signed)
Anesthesia Post Note  Patient: Jose Hutchinson.  Procedure(s) Performed: CYSTOSCOPY WITH URETEROSCOPY (Right Ureter) CYSTOSCOPY WITH RENAL PELVIS MASS BIOPSY (Right Ureter) CYSTOSCOPY WITH STENT PLACEMENT (Right Ureter) CYSTOSCOPY WITH RETROGRADE PYELOGRAM (Right Ureter)  Patient location during evaluation: PACU Anesthesia Type: General Level of consciousness: awake and alert and oriented Pain management: pain level controlled Vital Signs Assessment: post-procedure vital signs reviewed and stable Respiratory status: spontaneous breathing, nonlabored ventilation and respiratory function stable Cardiovascular status: blood pressure returned to baseline and stable Postop Assessment: no signs of nausea or vomiting Anesthetic complications: no     Last Vitals:  Vitals:   03/08/18 1117 03/08/18 1130  BP: 124/83 136/87  Pulse: 72 68  Resp: 19 19  Temp: (!) 36.3 C (!) 36.1 C  SpO2: 98% 100%    Last Pain:  Vitals:   03/08/18 1130  TempSrc: Temporal  PainSc: 0-No pain                 Daquana Paddock

## 2018-03-08 NOTE — Transfer of Care (Signed)
Immediate Anesthesia Transfer of Care Note  Patient: Jose Hutchinson.  Procedure(s) Performed: CYSTOSCOPY WITH URETEROSCOPY (Right Ureter) CYSTOSCOPY WITH RENAL PELVIS MASS BIOPSY (Right Ureter) CYSTOSCOPY WITH STENT PLACEMENT (Right Ureter) CYSTOSCOPY WITH RETROGRADE PYELOGRAM (Right Ureter)  Patient Location: PACU  Anesthesia Type:General  Level of Consciousness: drowsy and patient cooperative  Airway & Oxygen Therapy: Patient Spontanous Breathing and Patient connected to face mask oxygen  Post-op Assessment: Report given to RN and Post -op Vital signs reviewed and stable  Post vital signs: Reviewed and stable  Last Vitals:  Vitals Value Taken Time  BP 122/81 03/08/2018 10:49 AM  Temp 36.3 C 03/08/2018 10:49 AM  Pulse 68 03/08/2018 10:50 AM  Resp 16 03/08/2018 10:50 AM  SpO2 100 % 03/08/2018 10:50 AM  Vitals shown include unvalidated device data.  Last Pain:  Vitals:   03/08/18 0748  TempSrc: Tympanic  PainSc: 0-No pain         Complications: No apparent anesthesia complications

## 2018-03-08 NOTE — Interval H&P Note (Signed)
History and Physical Interval Note:  03/08/2018 9:07 AM  Jose Hutchinson.  has presented today for surgery, with the diagnosis of right renal pelvic mass  The various methods of treatment have been discussed with the patient and family. After consideration of risks, benefits and other options for treatment, the patient has consented to  Procedure(s): CYSTOSCOPY WITH URETEROSCOPY (Right) CYSTOSCOPY WITH RENAL PELVIS MASS BIOPSY (Right) CYSTOSCOPY WITH STENT PLACEMENT (Right) as a surgical intervention .  The patient's history has been reviewed, patient examined, no change in status, stable for surgery.  I have reviewed the patient's chart and labs.  Questions were answered to the patient's satisfaction.    RRR CTAB  Hollice Espy

## 2018-03-08 NOTE — OR Nursing (Signed)
Attestation and day of surgery note in epic by MD.

## 2018-03-08 NOTE — Discharge Instructions (Signed)
You have a ureteral stent in place.  This is a tube that extends from your kidney to your bladder.  This may cause urinary bleeding, burning with urination, and urinary frequency.  Please call our office or present to the ED if you develop fevers >101 or pain which is not able to be controlled with oral pain medications.  You may be given either Flomax and/ or ditropan to help with bladder spasms and stent pain in addition to pain medications.    Your stent is on a string taped to the head of your penis.  I like you to keep this in place until you hear back from me want to have your pathology results.  I will give you further instructions regarding stent removal at that point in time.  [facility] [address], [place], [place] [address] [phone]    Ureteroscopy, Care After This sheet gives you information about how to care for yourself after your procedure. Your health care provider may also give you more specific instructions. If you have problems or questions, contact your health care provider. What can I expect after the procedure? After the procedure, it is common to have:  A burning sensation when you urinate.  Blood in your urine.  Mild discomfort in the bladder area or kidney area when urinating.  Needing to urinate more often or urgently. Follow these instructions at home:  Medicines  Take over-the-counter and prescription medicines only as told by your health care provider.  If you were prescribed an antibiotic medicine, take it as told by your health care provider. Do not stop taking the antibiotic even if you start to feel better. General instructions  Donot drive for 24 hours if you were given a medicine to help you relax (sedative) during your procedure.  To relieve burning, try taking a warm bath or holding a warm washcloth over your groin.  Drink enough fluid to keep your urine clear or pale yellow. ? Drink two 8-ounce  glasses of water every hour for the first 2 hours after you get home. ? Continue to drink water often at home.  You can eat what you usually do.  Keep all follow-up visits as told by your health care provider. This is important. ? If you had a tube placed to keep urine flowing (ureteral stent), ask your health care provider when you need to return to have it removed. Contact a health care provider if:  You have chills or a fever.  You have burning pain for longer than 24 hours after the procedure.  You have blood in your urine for longer than 24 hours after the procedure. Get help right away if:  You have large amounts of blood in your urine.  You have blood clots in your urine.  You have very bad pain.  You have chest pain or trouble breathing.  You are unable to urinate and you have the feeling of a full bladder. This information is not intended to replace advice given to you by your health care provider. Make sure you discuss any questions you have with your health care provider. Document Released: 01/11/2013 Document Revised: 10/23/2015 Document Reviewed: 10/19/2015 Elsevier Interactive Patient Education  2019 [place]   1) The drugs that you were given will stay in your system until tomorrow so for the next 24 hours you should not:  A) Drive an automobile B) Make any legal decisions C) Drink  any alcoholic beverage   2) You may resume regular meals tomorrow.  Today it is better to start with liquids and gradually work up to solid foods.  You may eat anything you prefer, but it is better to start with liquids, then soup and crackers, and gradually work up to solid foods.   3) Please notify your doctor immediately if you have any unusual bleeding, trouble breathing, redness and pain at the surgery site, drainage, fever, or pain not relieved by medication.    4) Additional Instructions:        Please contact  your physician with any problems or Same Day Surgery at [phone], Monday through Friday 6 am to 4 pm, or [place] at [facility] number at [phone].

## 2018-03-08 NOTE — Anesthesia Preprocedure Evaluation (Signed)
Anesthesia Evaluation  Patient identified by MRN, date of birth, ID band Patient awake    Reviewed: Allergy & Precautions, NPO status , Patient's Chart, lab work & pertinent test results  History of Anesthesia Complications Negative for: history of anesthetic complications  Airway Mallampati: II  TM Distance: >3 FB Neck ROM: Full    Dental  (+) Partial Lower   Pulmonary neg pulmonary ROS, neg sleep apnea, neg COPD,    breath sounds clear to auscultation- rhonchi (-) wheezing      Cardiovascular Exercise Tolerance: Good (-) hypertension(-) CAD and (-) Past MI  Rhythm:Regular Rate:Normal - Systolic murmurs and - Diastolic murmurs    Neuro/Psych negative neurological ROS  negative psych ROS   GI/Hepatic negative GI ROS, Neg liver ROS,   Endo/Other  negative endocrine ROSneg diabetes  Renal/GU Renal mass     Musculoskeletal negative musculoskeletal ROS (+)   Abdominal (+) - obese,   Peds  Hematology negative hematology ROS (+)   Anesthesia Other Findings Past Medical History: No date: Hemorrhoid   Reproductive/Obstetrics                             Anesthesia Physical Anesthesia Plan  ASA: II  Anesthesia Plan: General   Post-op Pain Management:    Induction: Intravenous  PONV Risk Score and Plan: 1 and Ondansetron and Midazolam  Airway Management Planned: Oral ETT  Additional Equipment:   Intra-op Plan:   Post-operative Plan: Extubation in OR  Informed Consent: I have reviewed the patients History and Physical, chart, labs and discussed the procedure including the risks, benefits and alternatives for the proposed anesthesia with the patient or authorized representative who has indicated his/her understanding and acceptance.     Dental advisory given  Plan Discussed with: CRNA and Anesthesiologist  Anesthesia Plan Comments:         Anesthesia Quick Evaluation

## 2018-03-08 NOTE — Anesthesia Post-op Follow-up Note (Signed)
Anesthesia QCDR form completed.        

## 2018-03-08 NOTE — Op Note (Signed)
Date of procedure: 03/08/18  Preoperative diagnosis:  1. Right renal pelvic mass 2. Microscopic hematuria  Postoperative diagnosis:  1. Same as above  Procedure: 1. Cystoscopy 2. Right retrograde pyelogram 3. Right diagnostic ureteroscopy 4. Right renal pelvic mass biopsy 5. Right ureteral stent placement  Surgeon: Hollice Espy, MD  Anesthesia: General  Complications: None  Intraoperative findings: Large filling defect ~3.5 cm within the renal pelvis.  Papillary tumor on direct visualization highly concerning for urothelial carcinoma.  Multiple biopsies taken.  Stent placed on tether.  EBL: Minimal  Specimens: Right renal pelvic mass  Drains: 6 x 26 French double-J ureteral stent on right  Indication: Jose Hutchinson. is a 58 y.o. patient with microscopic hematuria after a fall from a ladder found to have fairly large filling defect within the right renal pelvis/hilum.  After reviewing the management options for treatment, he elected to proceed with the above surgical procedure(s). We have discussed the potential benefits and risks of the procedure, side effects of the proposed treatment, the likelihood of the patient achieving the goals of the procedure, and any potential problems that might occur during the procedure or recuperation. Informed consent has been obtained.  Description of procedure:  The patient was taken to the operating room and general anesthesia was induced.  The patient was placed in the dorsal lithotomy position, prepped and draped in the usual sterile fashion, and preoperative antibiotics were administered. A preoperative time-out was performed.   21 [place] scope was advanced per urethra into the bladder.  The bladder was inspected and no obvious tumor or lesions were identified.  Attention was turned to the right ureteral orifice which was cannulated using a sensor wire and a 5 [place] open-ended ureteral catheter.  A gentle retrograde pyelogram was  performed which showed a delicate appearing ureter without filling defects but a large at least 0.5 x 2.5 cm filling defect involving the renal pelvis, circular in appearance highly concerning for malignancy.  A sensor wire was in place up to level of the kidney.  A dual-lumen ureteral access sheath was then used to introduce a second Super Stiff wire.  In order to help facilitate basketing of fragment, I did elect to use a 12/14 [place] Cook ureteral access sheath which was advanced to the proximal ureter without difficulty under fluoroscopic guidance.  The inner cannula and Super Stiff wire were then removed.  A dual-lumen flexible ureteroscope was then able to be advanced to the renal pelvis, notably it was relatively tight in the proximal ureter.  Within the renal pelvis, large papillary mass was identified.  This was most consistent with urothelial carcinoma based on its visual appearance.  A Gemini basket was used to biopsy several large pieces of tumor which were passed off the field as right renal mass.  Given the size of the tumor, transurethral ablation with laser was not possible or appropriate.  Scope was then backed out length of the ureter inspecting along the way.  There are no ureteral tumors or injuries appreciated.  The safety wire was then backloaded over rigid cystoscope.  A 6 x 26 French double-J ureteral stent was then advanced over the wire up to level the kidney.  The wires partially drawn till focal stone within the renal pelvis.  The wires and fully withdrawn a focal stone within the bladder.  The stent string was left affixed to the distal coil of the stent.  The string was attached to the patient's glans using Mastisol and Tegaderm.  Patient was then clean and dry, repositioned the supine position, reversed anesthesia, taken the PACU in stable condition.  Plan: Findings were discussed with the patient's wife.  I did express my concern for urothelial carcinoma.  Soon as I have the  pathology, I will call him with plans for the stent.  If we do not need to go back and get additional tissue samples, will have him self remove the stent, otherwise we will leave it in place if his second procedure is deemed necessary.  He will follow-up in my office in 2 weeks to discuss pathology.  Hollice Espy, M.D.

## 2018-03-08 NOTE — Anesthesia Procedure Notes (Signed)
Procedure Name: Intubation Date/Time: 03/08/2018 9:42 AM Performed by: Jonna Clark, CRNA Pre-anesthesia Checklist: Patient identified, Patient being monitored, Timeout performed, Emergency Drugs available and Suction available Patient Re-evaluated:Patient Re-evaluated prior to induction Oxygen Delivery Method: Circle system utilized Preoxygenation: Pre-oxygenation with 100% oxygen Induction Type: IV induction Ventilation: Mask ventilation without difficulty Laryngoscope Size: 3 and Miller Grade View: Grade I Tube type: Oral Tube size: 7.5 mm Number of attempts: 1 Placement Confirmation: ETT inserted through vocal cords under direct vision,  positive ETCO2 and breath sounds checked- equal and bilateral Secured at: 21 cm Tube secured with: Tape Dental Injury: Teeth and Oropharynx as per pre-operative assessment

## 2018-03-09 ENCOUNTER — Other Ambulatory Visit: Payer: Self-pay | Admitting: Pathology

## 2018-03-09 LAB — SURGICAL PATHOLOGY

## 2018-03-10 ENCOUNTER — Other Ambulatory Visit: Payer: Self-pay | Admitting: Urology

## 2018-03-10 ENCOUNTER — Telehealth: Payer: Self-pay | Admitting: Radiology

## 2018-03-10 NOTE — Telephone Encounter (Signed)
Patient notified of cancer in the specimen & that Dr Erlene Quan can review in more detail at follow up appointment. Patient was originally scheduled for follow up on 03/24/2018 afternoon, however, patient states he works second shift & must have a morning appointment. Refuses afternoon or [place] location appointment. Next available to meet patient's request is 04/14/2018. Encouraged patient to keep original appointment due to diagnosis. Patient states he is agreeable to waiting for a more convienient appointment stating he wants proof of cancer in the specimen & may want a second opinion.

## 2018-03-10 NOTE — Telephone Encounter (Signed)
It looks like I have availability on 2/26 at 10 AM.  Does this work for him?  It is extremely imperative that he keep this follow-up appointment with me at minimum to discuss his options.  Hollice Espy, MD

## 2018-03-10 NOTE — Telephone Encounter (Signed)
-----   Message from Hollice Espy, MD sent at 03/09/2018  1:43 PM EST ----- Please let this patient know that as discussed postop, there was cancer in the specimen.  I will review in more detail at his follow-up.  Based on the findings, I do not think we need to go back and get any more tissue.  As such, he may remove his own stent Thursday.  Its on a tether.  Hollice Espy, MD

## 2018-03-10 NOTE — Telephone Encounter (Signed)
Patient is agreeable to follow up on 03/17/2018 at 10:00. Appointment made.

## 2018-03-11 ENCOUNTER — Inpatient Hospital Stay: Payer: BLUE CROSS/BLUE SHIELD | Attending: Urology

## 2018-03-11 ENCOUNTER — Encounter: Payer: Self-pay | Admitting: Urology

## 2018-03-11 NOTE — Progress Notes (Signed)
03/17/2018 8:32 AM   Jose Hutchinson. 1960/07/16 [id number]  Referring provider: Steele Sizer, MD [address] [place], [place] [address]  Chief Complaint  Patient presents with  . Post-op Follow-up    biopsy results    HPI: Jose Hutchinson. is a 58 yo M who returns to discuss his surgical pathology results and new diagnosis of right upper tract urothelial carcinoma.  His UA from Jan 25, 2018 revealed 4-10 RBC, 4-10 WBC and no bacteria.   He underwent a CT scan of the abdomen and pelvis on Feb 15, 2018 which showed a large filling defect in the right upper. No metastaic disease.   He had a cystoscopy with ureteroscopy on 03/08/2018 which indicated a large filling defect ~3.5 cm within the renal pelvis. Papillary tumor on direct visualization highly concerning for urothelial carcinoma.   His surgical pathology revealed a renal pelvic tumor on the right side which was high grade papillary urothelial carcinoma, noninvasive.  Stent removal was not bothersome with the exception of blood and pain. He currently does not experience gross hematuria and pain.   Pt has concerns of limitations associated with nephroureterectomy and chemotherapy.   He reports of smoking in the past.   PMH: Past Medical History:  Diagnosis Date  . Hemorrhoid     Surgical History: Past Surgical History:  Procedure Laterality Date  . COLONOSCOPY  10/07/13  . CYSTOSCOPY W/ RETROGRADES Right 03/08/2018   Procedure: CYSTOSCOPY WITH RETROGRADE PYELOGRAM;  Surgeon: Jose Espy, MD;  Location: [facility];  Service: Urology;  Laterality: Right;  . CYSTOSCOPY WITH BIOPSY Right 03/08/2018   Procedure: CYSTOSCOPY WITH RENAL PELVIS MASS BIOPSY;  Surgeon: Jose Espy, MD;  Location: [facility];  Service: Urology;  Laterality: Right;  . CYSTOSCOPY WITH STENT PLACEMENT Right 03/08/2018   Procedure: CYSTOSCOPY WITH STENT PLACEMENT;  Surgeon: Jose Espy, MD;  Location: [facility];  Service:  Urology;  Laterality: Right;  . CYSTOSCOPY WITH URETEROSCOPY Right 03/08/2018   Procedure: CYSTOSCOPY WITH URETEROSCOPY;  Surgeon: Jose Espy, MD;  Location: [facility];  Service: Urology;  Laterality: Right;  . WISDOM TOOTH EXTRACTION      Home Medications:  Allergies as of 03/17/2018   No Known Allergies     Medication List    as of March 17, 2018 11:59 PM   You have not been prescribed any medications.     Allergies: No Known Allergies  Family History: No family history on file.  Social History:  reports that he has never smoked. He has never used smokeless tobacco. He reports current alcohol use. He reports that he does not use drugs.  ROS: UROLOGY Frequent Urination?: No Hard to postpone urination?: No Burning/pain with urination?: No Get up at night to urinate?: No Leakage of urine?: No Urine stream starts and stops?: No Trouble starting stream?: No Do you have to strain to urinate?: No Blood in urine?: No Urinary tract infection?: No Sexually transmitted disease?: No Injury to kidneys or bladder?: No Painful intercourse?: No Weak stream?: No Erection problems?: No Penile pain?: No  Gastrointestinal Nausea?: No Vomiting?: No Indigestion/heartburn?: Yes Diarrhea?: No Constipation?: Yes  Constitutional Fever: No Night sweats?: No Weight loss?: No Fatigue?: No  Skin Skin rash/lesions?: No Itching?: No  Eyes Blurred vision?: No Double vision?: No  Ears/Nose/Throat Sore throat?: No Sinus problems?: No  Hematologic/Lymphatic Swollen glands?: No Easy bruising?: No  Cardiovascular Leg swelling?: No Chest pain?: No  Respiratory Cough?: No Shortness of breath?: No  Endocrine Excessive thirst?: No  Musculoskeletal Back pain?: No Joint pain?: No  Neurological Headaches?: No Dizziness?: No  Psychologic Depression?: No Anxiety?: No  Physical Exam: BP 133/81   Pulse 67   Ht 6\' 2"  (1.88 m)   Wt 214 lb (97.1 kg)   BMI  27.48 kg/m   Constitutional:  Alert and oriented, No acute distress. HEENT: Jose Hutchinson AT, moist mucus membranes.  Trachea midline, no masses. Cardiovascular: No clubbing, cyanosis, or edema. Respiratory: Normal respiratory effort, no increased work of breathing. Skin: No rashes, bruises or suspicious lesions. Neurologic: Grossly intact, no focal deficits, moving all 4 extremities. Psychiatric: Normal mood and affect.  Pertinent Imaging: Surgical Pathology  CASE: [id number]  PATIENT: Jose Hutchinson  Surgical Pathology Report      SPECIMEN SUBMITTED:  A. Renal pelvis tumor, right; biopsy   CLINICAL HISTORY:  Microscopic hematuria   PRE-OPERATIVE DIAGNOSIS:  Right renal pelvic mass   POST-OPERATIVE DIAGNOSIS:  Large, at least 0.5 x 2.5 cm filling defect involving the renal pelvis,  circular in appearance and highly concerning for malignancy      DIAGNOSIS:  A. RENAL PELVIS TUMOR, RIGHT; BIOPSY:  - PAPILLARY UROTHELIAL CARCINOMA, HIGH-GRADE (WHO/ISUP), NON-INVASIVE.   Comment:  These findings were communicated to Dr. Erlene Quan via [facility] secure text on  03/09/2018.    GROSS DESCRIPTION:  A. Labeled: Right renal pelvic tumor biopsy  Received: Formalin  Tissue fragment(s): Several  Size: Aggregate, 0.5 x 0.5 x 0.1 cm  Description: Tan soft tissue fragments  Entirely submitted in A1.    Final Diagnosis performed by Quay Burow, MD.  Electronically signed  03/09/2018 9:49:06AM  The electronic signature indicatesthat the named Attending Pathologist  has evaluated the specimen  Technical component performed at [facility], [address], [place],  [place] [address] Lab: [phone] Dir: Rush Farmer, MD, MMM Professional  component performed at [facility], [facility], [place], [place], [place] [address] Lab: [phone] Dir: Dellia Nims.  Reuel Derby, MD   I have reviewed the surgical pathology report.  His previous CT urogram was also personally reviewed  again today and with the patient.  Assessment & Plan:    1. Right upper tract urethelial carcinoma Pathology report showed high grade right upper tract urethelial carcinoma, noninvasive however findings on imaging and size of lesion is suggestive of concern for invasion Strongly recommended neoadjuvant chemotherapy as discussed by tumor board followed by nephroureterectomy since tumor can reoccur  Addressed concerns regarding chemotherapy and nephroureterectomy, he has significant concerns about his limitations from treatment as well as possible side effects.  These were addressed in detail with a lengthy conversation today. He is agreeable for treatment Referral sent to oncology for consultation, will call with appointment RTC in 3 months for pre-op for robotic nephroureterectomy.  Return in about 3 months (around 06/15/2018).  Jose Espy, MD  [facility] [address], [place], [place] [address] [phone]  I, Lucas Mallow, am acting as a scribe for Dr. Hollice Hutchinson,  I have reviewed the above documentation for accuracy and completeness, and I agree with the above.   Jose Espy, MD   I spent 25 min with this patient of which greater than 50% was spent in counseling and coordination of care with the patient.

## 2018-03-11 NOTE — Progress Notes (Signed)
Tumor Board Documentation  Jose Hutchinson. was presented by Dr Erlene Quan at our Tumor Board on 03/11/2018, which included representatives from medical oncology, radiation oncology, surgical, radiology, pathology, navigation, internal medicine, research, palliative care, pulmonology.  Jose Hutchinson currently presents for discussion, for Jose Hutchinson, for new positive pathology with history of the following treatments: surgical intervention(s).  Additionally, we reviewed previous medical and familial history, history of present illness, and recent lab results along with all available histopathologic and imaging studies. The tumor board considered available treatment options and made the following recommendations: Surgery, Neoadjuvant chemotherapy Refer to Medical Oncology  The following procedures/referrals were also placed: No orders of the defined types were placed in this encounter.   Clinical Trial Status: not discussed   Staging used: AJCC Stage Group  AJCC Staging:       Group: High Grade Papillary Urothelial Carcinoma   National site-specific guidelines NCCN were discussed with respect to the case.  Tumor board is a meeting of clinicians from various specialty areas who evaluate and discuss patients for whom a multidisciplinary approach is being considered. Final determinations in the plan of care are those of the provider(s). The responsibility for follow up of recommendations given during tumor board is that of the provider.   Today's extended care, comprehensive team conference, Jose Hutchinson was not present for the discussion and was not examined.   Multidisciplinary Tumor Board is a multidisciplinary case peer review process.  Decisions discussed in the Multidisciplinary Tumor Board reflect the opinions of the specialists present at the conference without having examined the patient.  Ultimately, treatment and diagnostic decisions rest with the primary provider(s) and the patient.

## 2018-03-17 ENCOUNTER — Ambulatory Visit (INDEPENDENT_AMBULATORY_CARE_PROVIDER_SITE_OTHER): Payer: BLUE CROSS/BLUE SHIELD | Admitting: Urology

## 2018-03-17 ENCOUNTER — Encounter: Payer: Self-pay | Admitting: Urology

## 2018-03-17 VITALS — BP 133/81 | HR 67 | Ht 74.0 in | Wt 214.0 lb

## 2018-03-17 DIAGNOSIS — C641 Malignant neoplasm of right kidney, except renal pelvis: Secondary | ICD-10-CM | POA: Diagnosis not present

## 2018-03-23 ENCOUNTER — Telehealth: Payer: Self-pay

## 2018-03-23 ENCOUNTER — Inpatient Hospital Stay: Payer: BLUE CROSS/BLUE SHIELD | Admitting: Oncology

## 2018-03-23 NOTE — Telephone Encounter (Signed)
Consultation/Slide/Block request authorization form faxed back to [employer] for specimen number [phone] for DOS 03-08-18

## 2018-03-24 ENCOUNTER — Ambulatory Visit: Payer: BLUE CROSS/BLUE SHIELD | Admitting: Urology

## 2018-04-14 ENCOUNTER — Ambulatory Visit: Payer: BLUE CROSS/BLUE SHIELD | Admitting: Urology

## 2018-06-16 ENCOUNTER — Ambulatory Visit: Payer: BLUE CROSS/BLUE SHIELD | Admitting: Urology

## 2019-05-01 IMAGING — CT CT HEAD W/O CM
3 series · 16 of 47 positions shown, 19 images · non-contrast
Comparison: None.

CLINICAL DATA: Fall off a ladder with left frontal injury. Initial
encounter.

EXAM:
CT HEAD WITHOUT CONTRAST
TECHNIQUE: Contiguous axial images were obtained from the base of the skull
through the vertex without intravenous contrast.

[Series 2: head wo · axial · 0.47mm/px · z∈[-199,-74]mm · 10 of 31 slices shown, 13 images]
[im 3/31  brain]
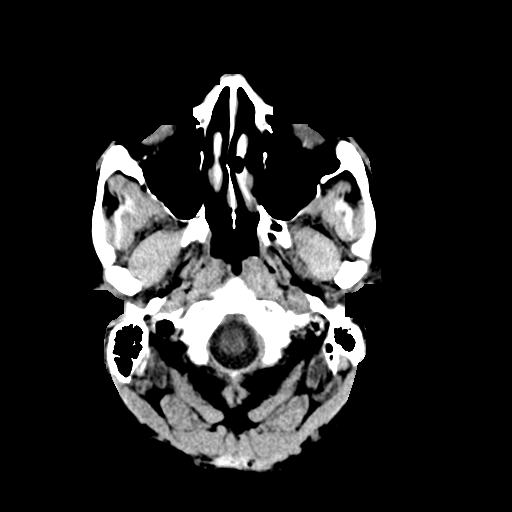
[im 3/31  bone]
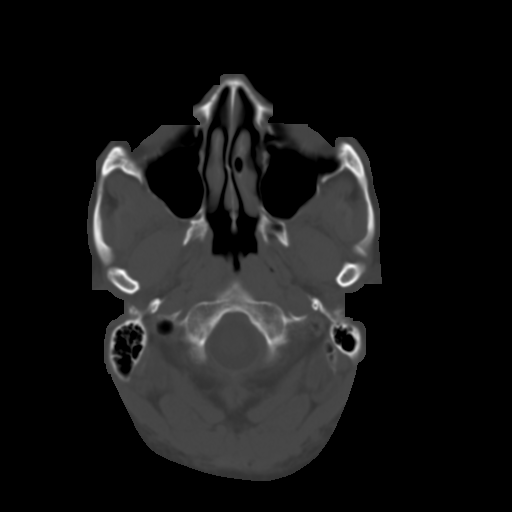
[im 6/31  brain]
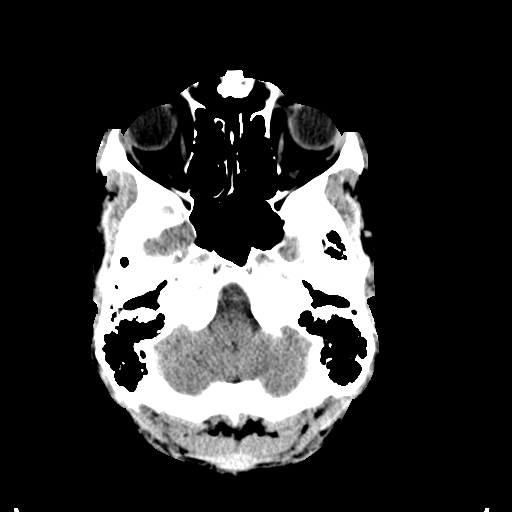
[im 9/31  brain]
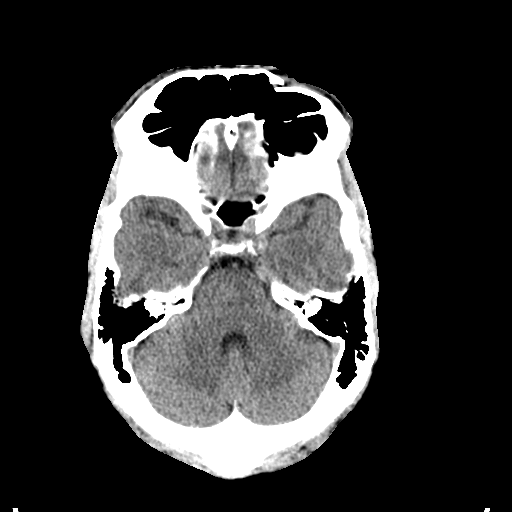
[im 11/31  brain]
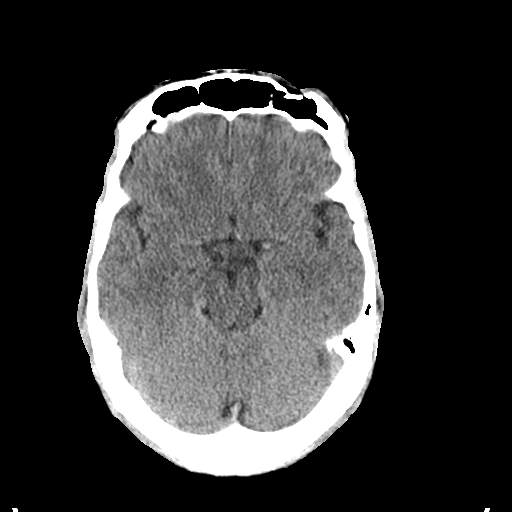
[im 14/31  brain]
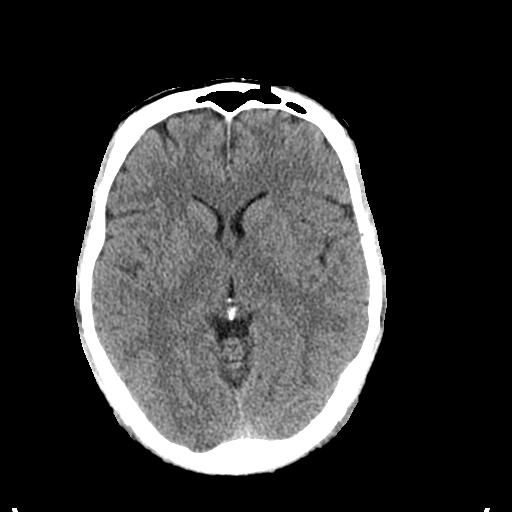
[im 14/31  bone]
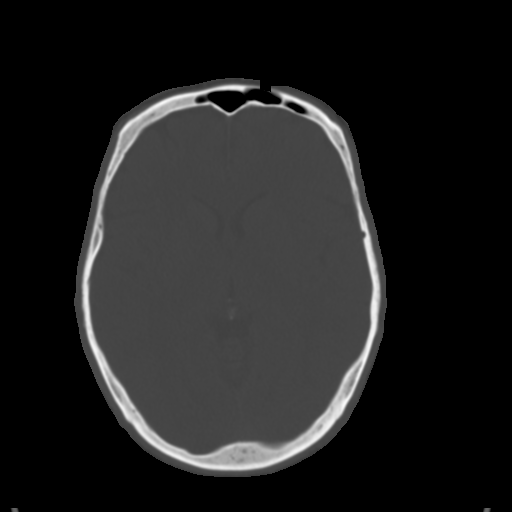
[im 17/31  brain]
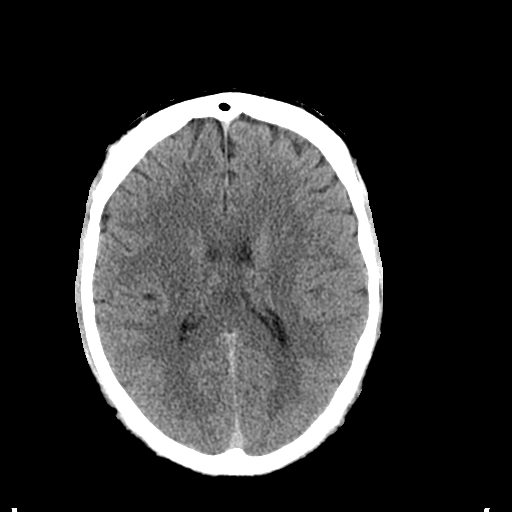
[im 20/31  brain]
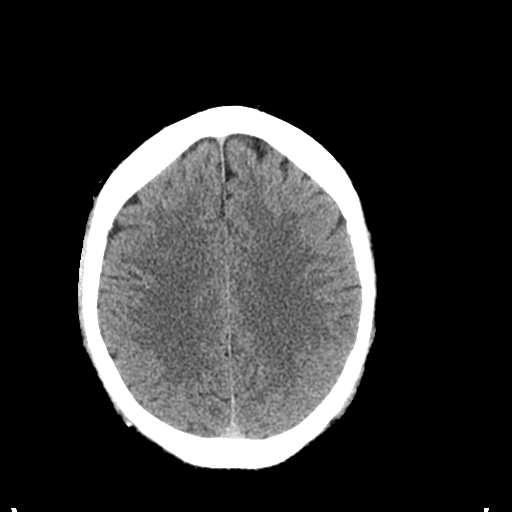
[im 23/31  brain]
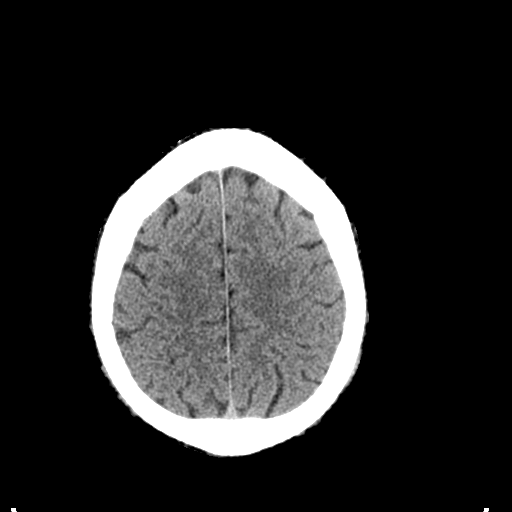
[im 25/31  brain]
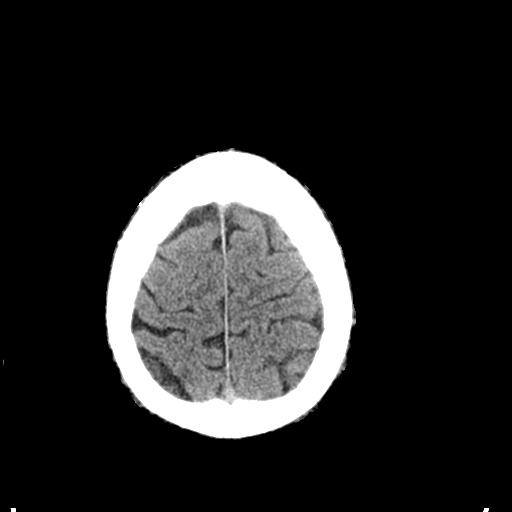
[im 25/31  bone]
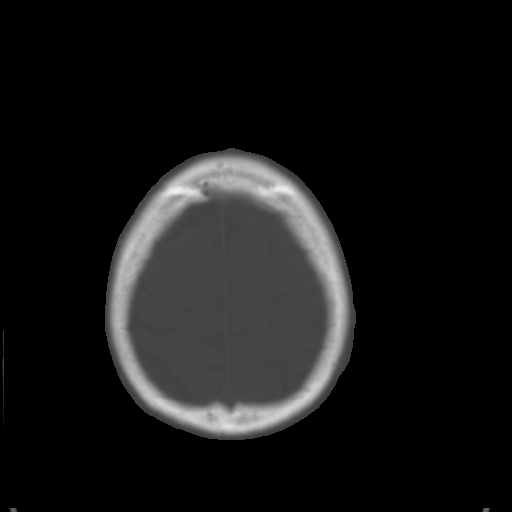
[im 28/31  brain]
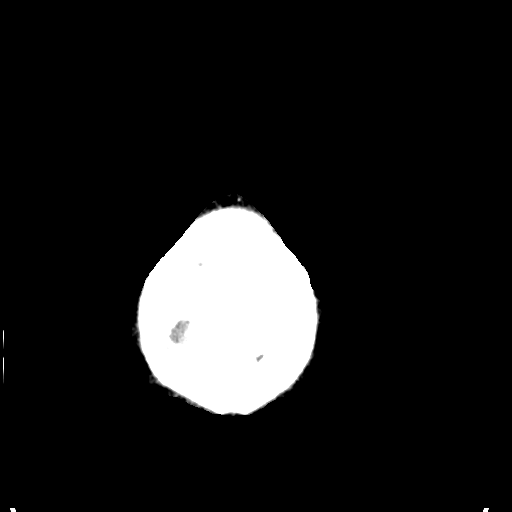

[Series 4: coronal soft tissue · coronal · 0.30mm/px · 3 of 68 slices shown]
[im 23/68  brain]
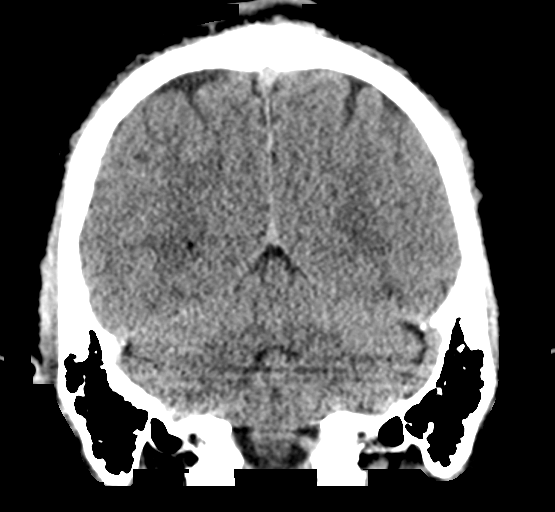
[im 30/68  brain]
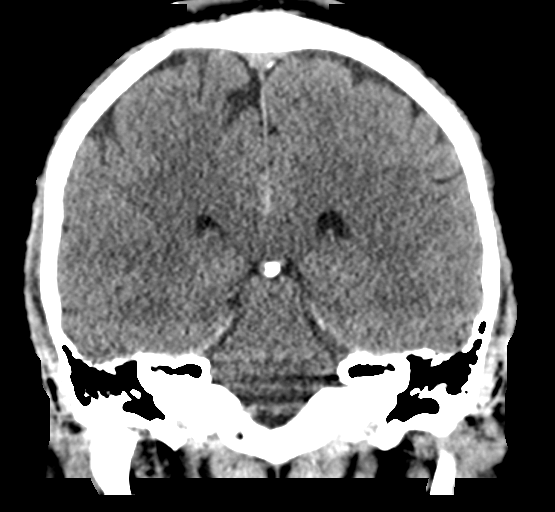
[im 38/68  brain]
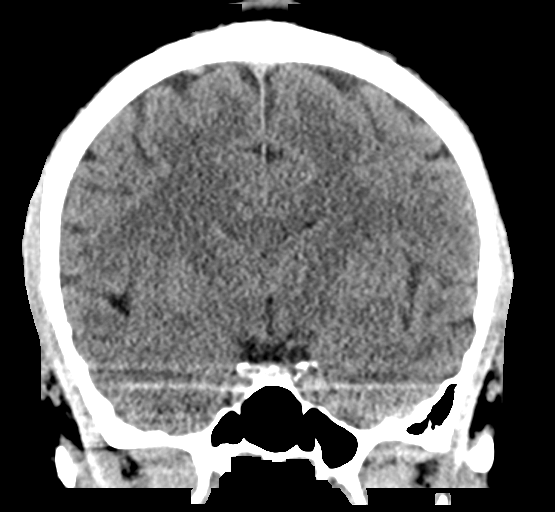

[Series 5: sagittal soft tissue · sagittal · 0.35mm/px · 3 of 56 slices shown]
[im 19/56  brain]
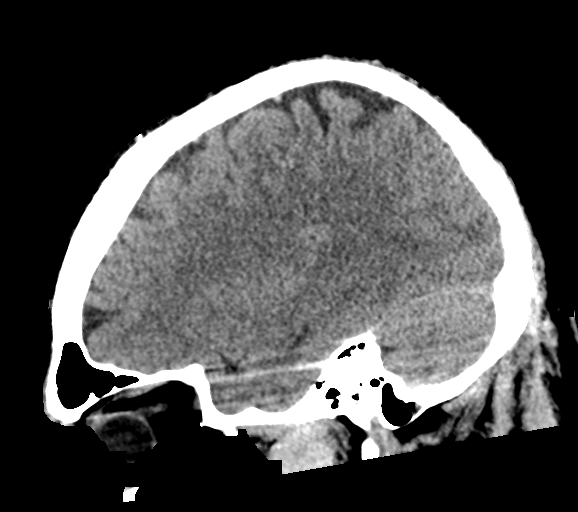
[im 28/56  brain]
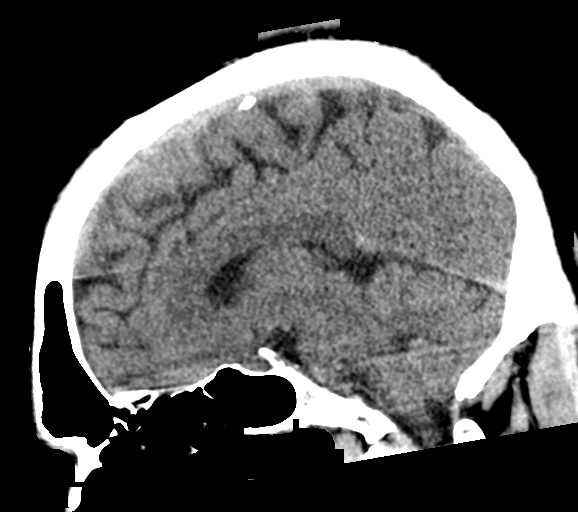
[im 37/56  brain]
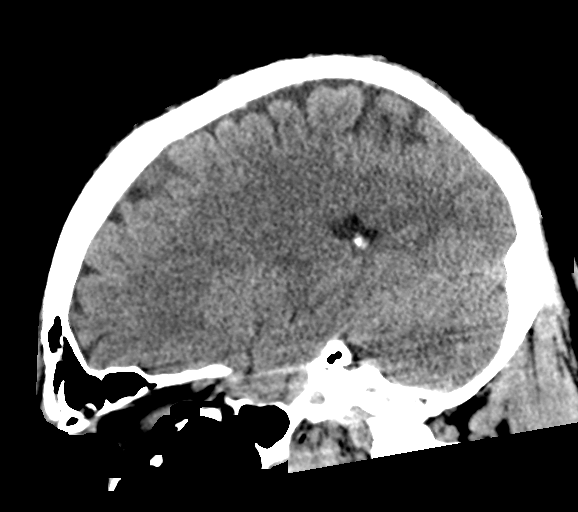

[16 of 47 positions shown; findings below may reference images not displayed]

FINDINGS: Brain: No evidence of acute infarction, hemorrhage, hydrocephalus,
extra-axial collection or mass lesion/mass effect.

Vascular: Negative

Skull: Fracture through the anterior wall of the left frontal sinus
with depression by up to 2 mm. The fracture continues to the left
superior orbital rim. No posterior frontal sinus fracture is seen.

Sinuses/Orbits: See above concerning fractures. No postseptal
stranding.
IMPRESSION: 1. No evidence of intracranial injury.
2. Left frontal sinus anterior wall fracture with depression by 2
mm. Fracture extends to the superior orbital rim.

## 2019-05-01 IMAGING — DX DG WRIST COMPLETE 3+V*R*
4 series · 4 of 4 positions shown · non-contrast
Comparison: None.

CLINICAL DATA: Right wrist pain after fall.

EXAM:
RIGHT WRIST - COMPLETE 3+ VIEW

[wrist ap (1 of 2)]
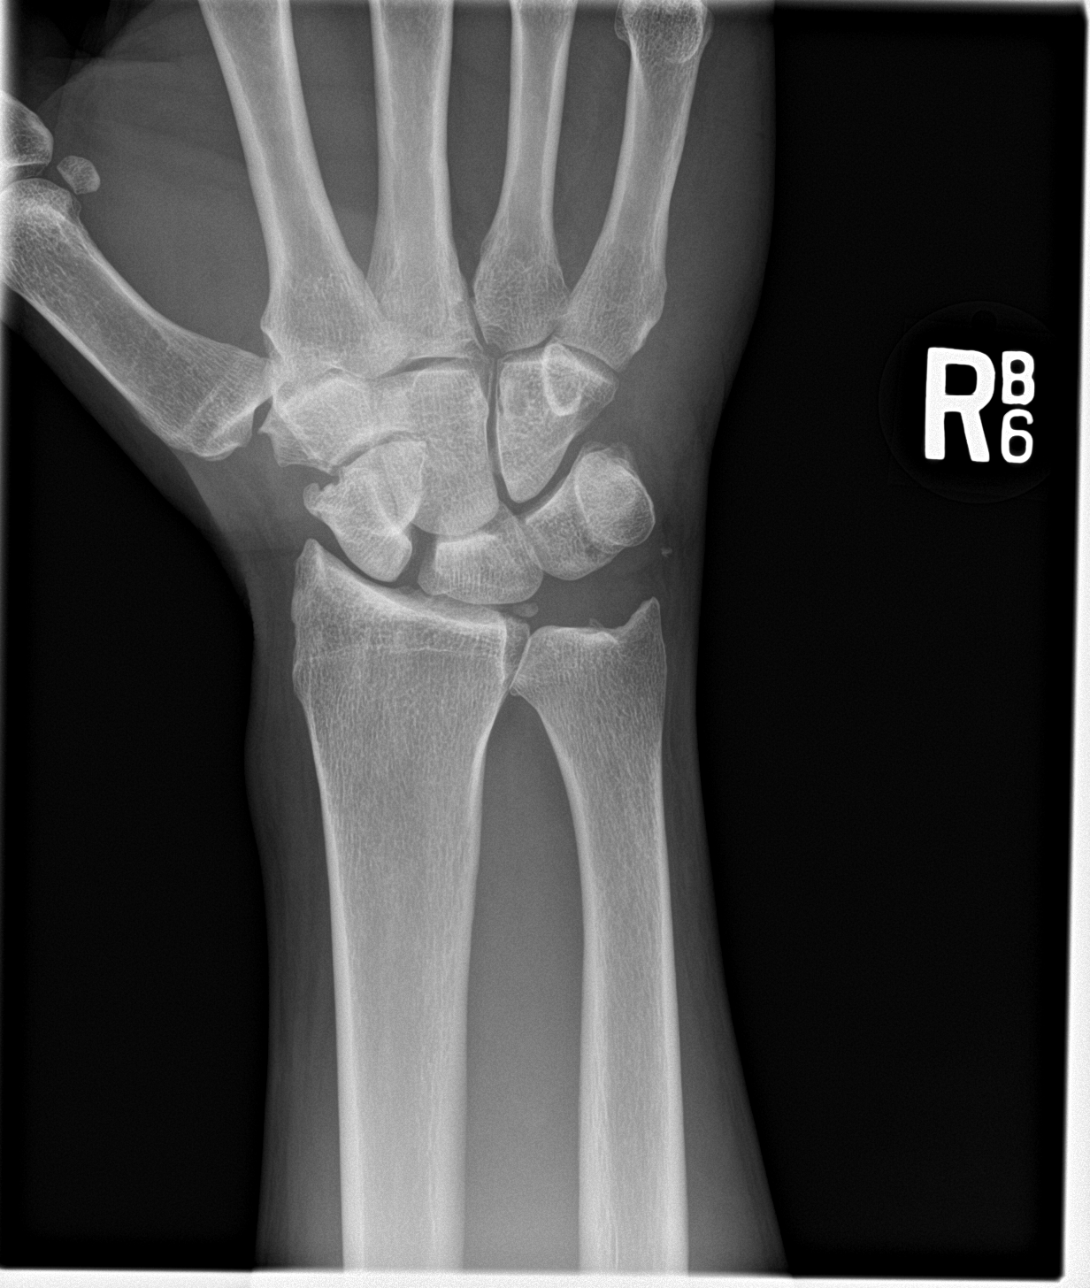

[wrist obl]
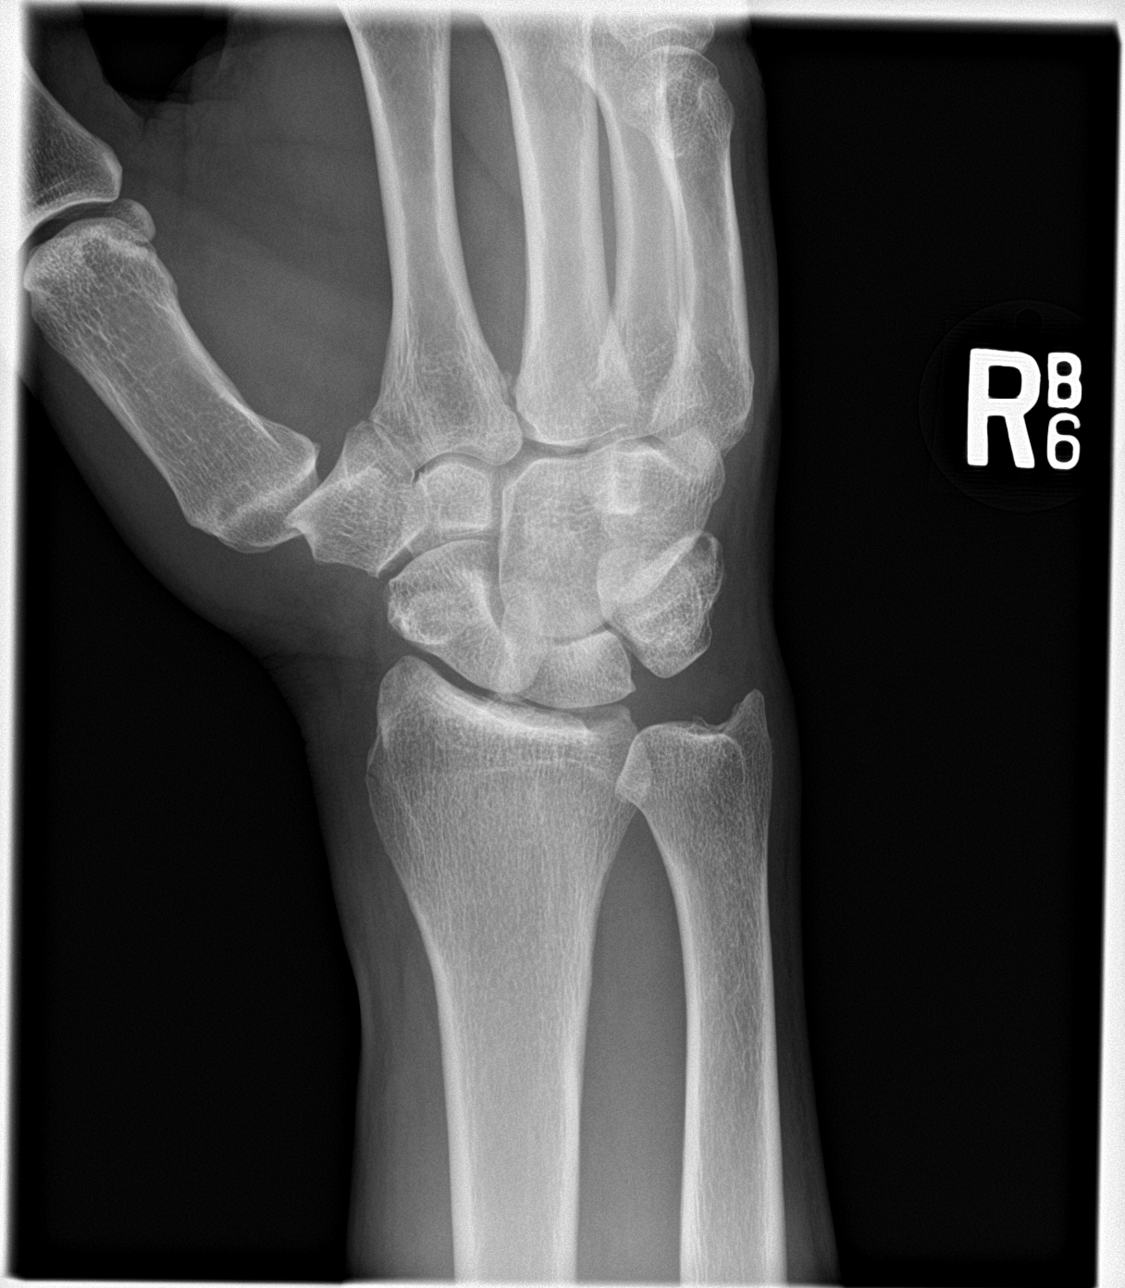

[wrist lat]
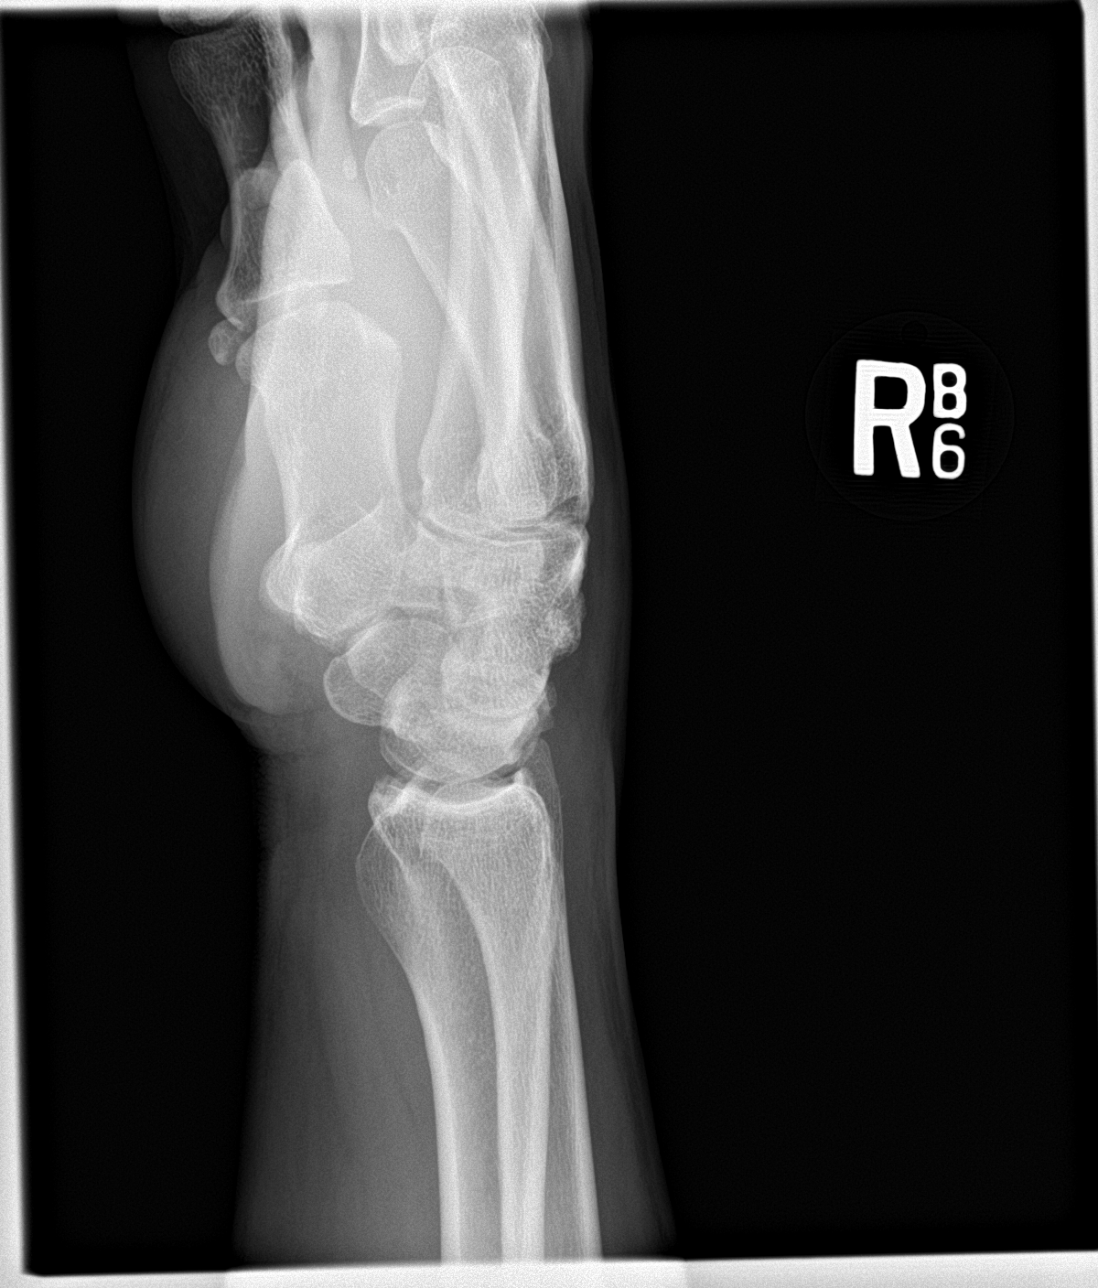

[wrist ap (2 of 2)]
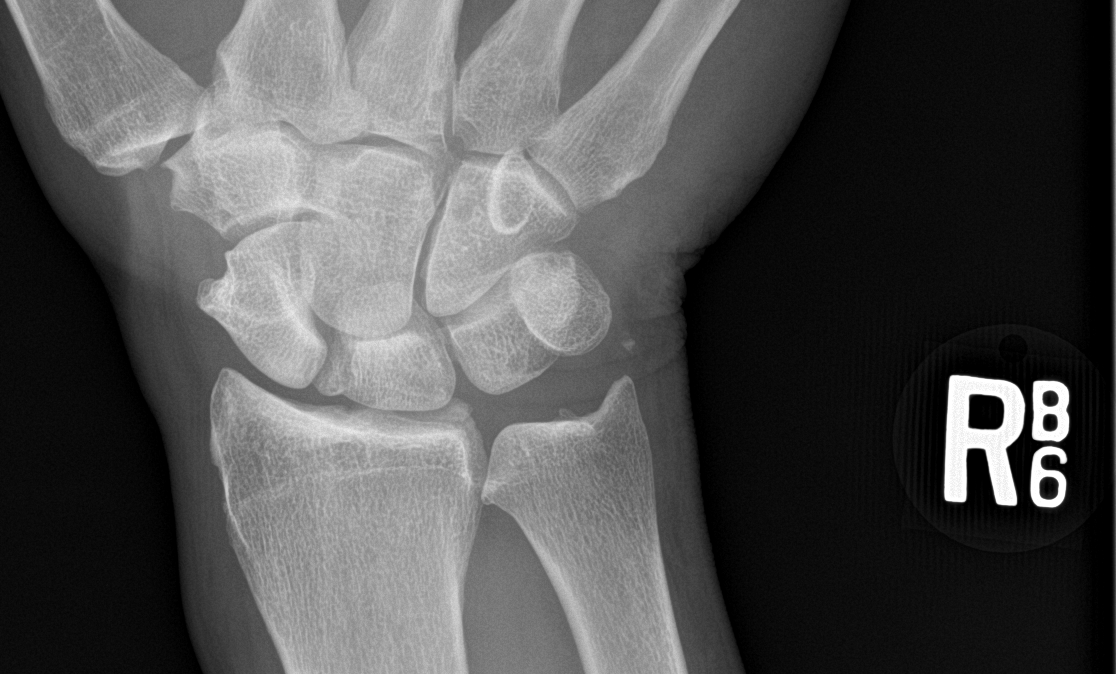

[4 of 4 positions shown; findings below may reference images not displayed]

FINDINGS: There is no evidence of fracture or dislocation. There is no
evidence of arthropathy or other focal bone abnormality. Soft
tissues are unremarkable.
IMPRESSION: No definite abnormality seen in the right wrist.
